# Patient Record
Sex: Female | Born: 1965 | Race: White | Hispanic: No | Marital: Single | Smoking: Former smoker
Health system: Southern US, Community
[De-identification: ages and names within clinical notes are randomized; demographics above are authoritative.]

## PROBLEM LIST (undated history)

## (undated) DIAGNOSIS — I10 Essential (primary) hypertension: Secondary | ICD-10-CM

## (undated) DIAGNOSIS — K635 Polyp of colon: Secondary | ICD-10-CM

## (undated) HISTORY — PX: CERVIX REMOVAL: SHX592

## (undated) HISTORY — PX: BREAST SURGERY: SHX581

## (undated) HISTORY — PX: APPENDECTOMY: SHX54

---

## 2005-04-05 ENCOUNTER — Ambulatory Visit: Payer: Self-pay

## 2005-09-17 HISTORY — PX: BREAST BIOPSY: SHX20

## 2006-04-23 ENCOUNTER — Ambulatory Visit: Payer: Self-pay

## 2018-02-14 ENCOUNTER — Ambulatory Visit (INDEPENDENT_AMBULATORY_CARE_PROVIDER_SITE_OTHER): Payer: Managed Care, Other (non HMO) | Admitting: Physician Assistant

## 2018-02-14 ENCOUNTER — Encounter: Payer: Self-pay | Admitting: Physician Assistant

## 2018-02-14 ENCOUNTER — Telehealth: Payer: Self-pay | Admitting: Physician Assistant

## 2018-02-14 VITALS — BP 122/78 | HR 60 | Temp 98.3°F | Resp 16 | Ht 64.0 in | Wt 176.0 lb

## 2018-02-14 DIAGNOSIS — Z114 Encounter for screening for human immunodeficiency virus [HIV]: Secondary | ICD-10-CM

## 2018-02-14 DIAGNOSIS — Z130 Encounter for screening for diseases of the blood and blood-forming organs and certain disorders involving the immune mechanism: Secondary | ICD-10-CM

## 2018-02-14 DIAGNOSIS — Z1329 Encounter for screening for other suspected endocrine disorder: Secondary | ICD-10-CM

## 2018-02-14 DIAGNOSIS — Z124 Encounter for screening for malignant neoplasm of cervix: Secondary | ICD-10-CM

## 2018-02-14 DIAGNOSIS — Z1231 Encounter for screening mammogram for malignant neoplasm of breast: Secondary | ICD-10-CM | POA: Diagnosis not present

## 2018-02-14 DIAGNOSIS — Z13220 Encounter for screening for lipoid disorders: Secondary | ICD-10-CM | POA: Diagnosis not present

## 2018-02-14 DIAGNOSIS — Z0000 Encounter for general adult medical examination without abnormal findings: Secondary | ICD-10-CM | POA: Diagnosis not present

## 2018-02-14 DIAGNOSIS — Z131 Encounter for screening for diabetes mellitus: Secondary | ICD-10-CM | POA: Diagnosis not present

## 2018-02-14 DIAGNOSIS — Z1239 Encounter for other screening for malignant neoplasm of breast: Secondary | ICD-10-CM

## 2018-02-14 DIAGNOSIS — Z1211 Encounter for screening for malignant neoplasm of colon: Secondary | ICD-10-CM

## 2018-02-14 NOTE — Telephone Encounter (Signed)
Order for cologuard faxed to [facility] °

## 2018-02-14 NOTE — Patient Instructions (Signed)

## 2018-02-14 NOTE — Progress Notes (Signed)
Patient: Heather Curry Female    DOB: 10-04-1965   52 y.o.   MRN: [id number] Visit Date: 02/14/2018  Today's Provider: Trinna Post, PA-C   Chief Complaint  Patient presents with  . Establish Care   Subjective:    HPI   Heather Curry is a 52 y/o woman presenting today for a new patient physical. She works as a Higher education careers adviser and was recently transferred from [employer] to [employer]. She is living alone in [place], does not have children. She reports she has a uterus and ovaries but not a cervix.  She says when she was 20 she had an abnormal PAP. Then ten years later she had a "grade four" PAP and was recommended to have a hysterectomy. She said she didn't want one because she wanted to have children. She says she had her cervix removed in the office, her mom came with her. She says she hasn't had a PAP since then.   She reports she has had fibrocystic mass removed from her breasts previously, had a mammogram many years ago.   She has never been screened for colon cancer. She denies family history of colon cancer.      Allergies  Allergen Reactions  . Bee Venom Anaphylaxis    No current outpatient medications on file.  Review of Systems  Constitutional: Negative.   HENT: Negative.   Eyes: Negative.   Cardiovascular: Negative.   Gastrointestinal: Negative.   Endocrine: Negative.   Genitourinary: Negative.   Musculoskeletal: Negative.   Skin: Negative.   Allergic/Immunologic: Negative.   Neurological: Negative.   Hematological: Negative.   Psychiatric/Behavioral: Negative.     Social History   Tobacco Use  . Smoking status: Never Smoker  . Smokeless tobacco: Never Used  Substance Use Topics  . Alcohol use: Never    Frequency: Never   Objective:   BP 122/78 (BP Location: Right Arm, Patient Position: Sitting, Cuff Size: Normal)   Pulse 60   Temp 98.3 F (36.8 C) (Oral)   Resp 16   Ht 5\' 4"  (1.626 m)   Wt 176 lb (79.8 kg)   BMI  30.21 kg/m  Vitals:   02/14/18 1420  BP: 122/78  Pulse: 60  Resp: 16  Temp: 98.3 F (36.8 C)  TempSrc: Oral  Weight: 176 lb (79.8 kg)  Height: 5\' 4"  (1.626 m)     Physical Exam  Constitutional: She appears well-developed and well-nourished.  Cardiovascular: Normal rate and regular rhythm.  Pulmonary/Chest: Effort normal and breath sounds normal.  Abdominal: Soft. Bowel sounds are normal.  Genitourinary: No labial fusion. There is no rash, tenderness, lesion or injury on the right labia. There is no rash, tenderness, lesion or injury on the left labia. Uterus is not deviated, not enlarged, not fixed and not tender. Cervix exhibits no motion tenderness, no discharge and no friability. Right adnexum displays no mass, no tenderness and no fullness. Left adnexum displays no mass, no tenderness and no fullness. No erythema, tenderness or bleeding in the vagina. No foreign body in the vagina. No signs of injury around the vagina. No vaginal discharge found.  Genitourinary Comments: Cervix is PRESENT.  Skin: Skin is warm and dry.  Psychiatric: She has a normal mood and affect. Her behavior is normal.        Assessment & Plan:     1. Annual physical exam   2. Colon cancer screening  - Cologuard  3. Breast cancer screening  -  MM 3D SCREEN BREAST BILATERAL; Future  4. Cervical cancer screening  Patient DOES have a cervix. Advised patient that she did not have her cervix removed, she likely had a LEEP or other procedure done for CIN.   - Pap IG and HPV (high risk) DNA detection  5. Encounter for screening for HIV  - HIV antibody (with reflex)  6. Lipid screening  - Lipid Profile  7. Diabetes mellitus screening  - Comprehensive Metabolic Panel (CMET)  8. Screening for deficiency anemia  - CBC with Differential  9. Thyroid disorder screening  - TSH  Return in about 1 year (around 02/15/2019) for CPE.  The entirety of the information documented in the History of  Present Illness, Review of Systems and Physical Exam were personally obtained by me. Portions of this information were initially documented by Ashley Royalty, CMA and reviewed by me for thoroughness and accuracy.         Trinna Post, PA-C  [facility]

## 2018-02-15 LAB — HIV ANTIBODY (ROUTINE TESTING W REFLEX): HIV Screen 4th Generation wRfx: NONREACTIVE

## 2018-02-15 LAB — COMPREHENSIVE METABOLIC PANEL
ALT: 30 IU/L (ref 0–32)
AST: 15 IU/L (ref 0–40)
Albumin/Globulin Ratio: 1.8 (ref 1.2–2.2)
Albumin: 4.5 g/dL (ref 3.5–5.5)
Alkaline Phosphatase: 118 IU/L — ABNORMAL HIGH (ref 39–117)
BUN/Creatinine Ratio: 16 (ref 9–23)
BUN: 13 mg/dL (ref 6–24)
Bilirubin Total: 0.2 mg/dL (ref 0.0–1.2)
CO2: 23 mmol/L (ref 20–29)
Calcium: 10.3 mg/dL — ABNORMAL HIGH (ref 8.7–10.2)
Chloride: 104 mmol/L (ref 96–106)
Creatinine, Ser: 0.8 mg/dL (ref 0.57–1.00)
GFR calc Af Amer: 99 mL/min/{1.73_m2} (ref >59)
GFR calc non Af Amer: 86 mL/min/{1.73_m2} (ref >59)
Globulin, Total: 2.5 g/dL (ref 1.5–4.5)
Glucose: 85 mg/dL (ref 65–99)
Potassium: 4.3 mmol/L (ref 3.5–5.2)
Sodium: 142 mmol/L (ref 134–144)
Total Protein: 7 g/dL (ref 6.0–8.5)

## 2018-02-15 LAB — CBC WITH DIFFERENTIAL/PLATELET
Basophils Absolute: 0 10*3/uL (ref 0.0–0.2)
Basos: 0 %
EOS (ABSOLUTE): 0.1 10*3/uL (ref 0.0–0.4)
Eos: 2 %
Hematocrit: 41.6 % (ref 34.0–46.6)
Hemoglobin: 13.8 g/dL (ref 11.1–15.9)
Immature Grans (Abs): 0 10*3/uL (ref 0.0–0.1)
Immature Granulocytes: 0 %
Lymphocytes Absolute: 2.9 10*3/uL (ref 0.7–3.1)
Lymphs: 38 %
MCH: 27.9 pg (ref 26.6–33.0)
MCHC: 33.2 g/dL (ref 31.5–35.7)
MCV: 84 fL (ref 79–97)
Monocytes Absolute: 0.7 10*3/uL (ref 0.1–0.9)
Monocytes: 9 %
Neutrophils Absolute: 4 10*3/uL (ref 1.4–7.0)
Neutrophils: 51 %
Platelets: 283 10*3/uL (ref 150–450)
RBC: 4.94 x10E6/uL (ref 3.77–5.28)
RDW: 13.6 % (ref 12.3–15.4)
WBC: 7.6 10*3/uL (ref 3.4–10.8)

## 2018-02-15 LAB — LIPID PANEL
Chol/HDL Ratio: 5.6 ratio — ABNORMAL HIGH (ref 0.0–4.4)
Cholesterol, Total: 209 mg/dL — ABNORMAL HIGH (ref 100–199)
HDL: 37 mg/dL — ABNORMAL LOW (ref >39)
LDL Calculated: 160 mg/dL — ABNORMAL HIGH (ref 0–99)
Triglycerides: 61 mg/dL (ref 0–149)
VLDL Cholesterol Cal: 12 mg/dL (ref 5–40)

## 2018-02-15 LAB — TSH: TSH: 3.92 u[IU]/mL (ref 0.450–4.500)

## 2018-02-18 ENCOUNTER — Telehealth: Payer: Self-pay

## 2018-02-18 NOTE — Telephone Encounter (Signed)
-----   Message from Trinna Post, [place] sent at 02/17/2018  2:59 PM EDT ----- Cholesterol high but not requiring treatment. CMET normal. CBC and TSH normal. HIV nonreactive. PAP is pending. Will send in 12.5 mg HCTZ which is fluid pill for her. Can see her back in one month for follow up of this.

## 2018-02-18 NOTE — Telephone Encounter (Signed)
Pt advised.   Thanks,   -Remmie Bembenek  

## 2018-02-19 LAB — PAP IG AND HPV HIGH-RISK
HPV, high-risk: NEGATIVE
PAP Smear Comment: 0

## 2018-02-20 ENCOUNTER — Telehealth: Payer: Self-pay

## 2018-02-20 NOTE — Telephone Encounter (Signed)
-----   Message from Trinna Post, [place] sent at 02/20/2018  9:15 AM EDT ----- PAP normal, HPV negative. I'm unsure of her history of her PAP smears. I would be inclined to repeat her pap in one year. If patient could remember provider so we can get records, that would be best so we can request them.

## 2018-02-20 NOTE — Telephone Encounter (Signed)
Pt advised.   Thanks,   -Genice Kimberlin  

## 2018-03-12 ENCOUNTER — Ambulatory Visit
Admission: RE | Admit: 2018-03-12 | Discharge: 2018-03-12 | Disposition: A | Discharge status: Home / Self Care | Payer: Managed Care, Other (non HMO) | Source: Ambulatory Visit | Attending: Physician Assistant | Admitting: Physician Assistant

## 2018-03-12 DIAGNOSIS — Z1231 Encounter for screening mammogram for malignant neoplasm of breast: Secondary | ICD-10-CM | POA: Insufficient documentation

## 2018-03-12 DIAGNOSIS — Z1239 Encounter for other screening for malignant neoplasm of breast: Secondary | ICD-10-CM

## 2018-03-15 LAB — COLOGUARD: Cologuard: POSITIVE

## 2018-04-01 ENCOUNTER — Telehealth: Payer: Self-pay | Admitting: Physician Assistant

## 2018-04-01 DIAGNOSIS — R195 Other fecal abnormalities: Secondary | ICD-10-CM

## 2018-04-01 NOTE — Telephone Encounter (Signed)
This patient had a positive cologuard witch will require a follow up colonoscopy. Can we call her and let her know? I have placed a referral to GI already. Thank you.

## 2018-04-02 ENCOUNTER — Other Ambulatory Visit: Payer: Self-pay

## 2018-04-02 ENCOUNTER — Telehealth: Payer: Self-pay | Admitting: Gastroenterology

## 2018-04-02 DIAGNOSIS — R195 Other fecal abnormalities: Secondary | ICD-10-CM

## 2018-04-02 NOTE — Telephone Encounter (Signed)
Patient advised.KW 

## 2018-04-02 NOTE — Telephone Encounter (Signed)
Patient needs to reschedule her procedure that same week but on Friday. Please call Wk [phone] Cell [phone]

## 2018-04-02 NOTE — Telephone Encounter (Signed)
Colonoscopy rescheduled to 04/18/18 [facility] Dr. Bonna Gains.  Notified Endo and updated referral with Traci.  Thanks [employer]

## 2018-04-04 ENCOUNTER — Encounter: Payer: Self-pay | Admitting: Physician Assistant

## 2018-04-08 ENCOUNTER — Other Ambulatory Visit: Payer: Self-pay

## 2018-04-18 ENCOUNTER — Ambulatory Visit
Admission: RE | Admit: 2018-04-18 | Discharge: 2018-04-18 | Disposition: A | Discharge status: Home / Self Care | Payer: Managed Care, Other (non HMO) | Source: Ambulatory Visit | Attending: Gastroenterology | Admitting: Gastroenterology

## 2018-04-18 ENCOUNTER — Encounter
Admission: RE | Disposition: A | Discharge status: Home / Self Care | Payer: Self-pay | Source: Ambulatory Visit | Attending: Gastroenterology

## 2018-04-18 ENCOUNTER — Encounter: Payer: Self-pay | Admitting: Anesthesiology

## 2018-04-18 ENCOUNTER — Ambulatory Visit: Payer: Managed Care, Other (non HMO) | Admitting: Anesthesiology

## 2018-04-18 DIAGNOSIS — K5730 Diverticulosis of large intestine without perforation or abscess without bleeding: Secondary | ICD-10-CM | POA: Diagnosis not present

## 2018-04-18 DIAGNOSIS — R195 Other fecal abnormalities: Secondary | ICD-10-CM | POA: Diagnosis present

## 2018-04-18 DIAGNOSIS — Z8249 Family history of ischemic heart disease and other diseases of the circulatory system: Secondary | ICD-10-CM | POA: Diagnosis not present

## 2018-04-18 DIAGNOSIS — Z91030 Bee allergy status: Secondary | ICD-10-CM | POA: Diagnosis not present

## 2018-04-18 DIAGNOSIS — D122 Benign neoplasm of ascending colon: Secondary | ICD-10-CM | POA: Diagnosis not present

## 2018-04-18 DIAGNOSIS — Z87892 Personal history of anaphylaxis: Secondary | ICD-10-CM | POA: Insufficient documentation

## 2018-04-18 DIAGNOSIS — D120 Benign neoplasm of cecum: Secondary | ICD-10-CM | POA: Diagnosis not present

## 2018-04-18 HISTORY — PX: COLONOSCOPY WITH PROPOFOL: SHX5780

## 2018-04-18 SURGERY — COLONOSCOPY WITH PROPOFOL
Anesthesia: General

## 2018-04-18 MED ORDER — PROPOFOL 500 MG/50ML IV EMUL
INTRAVENOUS | Status: DC | PRN
  Administered 2018-04-18: 125 ug/kg/min via INTRAVENOUS

## 2018-04-18 MED ORDER — SODIUM CHLORIDE 0.9 % IV SOLN
INTRAVENOUS | Status: DC | PRN
  Administered 2018-04-18: 11:00:00 via INTRAVENOUS

## 2018-04-18 MED ORDER — LIDOCAINE HCL (PF) 2 % IJ SOLN
INTRAMUSCULAR | Status: DC | PRN
  Administered 2018-04-18: 60 mg via INTRADERMAL

## 2018-04-18 MED ORDER — METHYLENE BLUE 0.5 % INJ SOLN
INTRAVENOUS | Status: DC | PRN
  Administered 2018-04-18 (×2): 10 mL via SUBMUCOSAL

## 2018-04-18 MED ORDER — SODIUM CHLORIDE 0.9 % IJ SOLN
INTRAMUSCULAR | Status: DC | PRN
  Administered 2018-04-18: 5 mL via INTRAVENOUS

## 2018-04-18 MED ORDER — SPOT INK MARKER SYRINGE KIT
PACK | SUBMUCOSAL | Status: DC | PRN
  Administered 2018-04-18: 5 mL via SUBMUCOSAL

## 2018-04-18 MED ORDER — SODIUM CHLORIDE 0.9 % IV SOLN
INTRAVENOUS | Status: DC
  Administered 2018-04-18: 1000 mL via INTRAVENOUS

## 2018-04-18 MED ORDER — PROPOFOL 500 MG/50ML IV EMUL
INTRAVENOUS | Status: AC
  Filled 2018-04-18: qty 50

## 2018-04-18 MED ORDER — PROPOFOL 10 MG/ML IV BOLUS
INTRAVENOUS | Status: DC | PRN
  Administered 2018-04-18 (×2): 20 mg via INTRAVENOUS
  Administered 2018-04-18: 30 mg via INTRAVENOUS
  Administered 2018-04-18: 20 mg via INTRAVENOUS

## 2018-04-18 NOTE — Anesthesia Postprocedure Evaluation (Signed)
Anesthesia Post Note  Patient: Heather Curry  Procedure(s) Performed: COLONOSCOPY WITH PROPOFOL (N/A )  Patient location during evaluation: Endoscopy Anesthesia Type: General Level of consciousness: awake and alert and oriented Pain management: pain level controlled Vital Signs Assessment: post-procedure vital signs reviewed and stable Respiratory status: spontaneous breathing Cardiovascular status: blood pressure returned to baseline Anesthetic complications: no     Last Vitals:  Vitals:   04/18/18 0833 04/18/18 1100  BP: (!) 128/91 (!) (P) 91/52  Pulse: 66 (P) 64  Resp: 20 (P) 20  Temp: (!) 35.8 C (!) (P) 36.1 C  SpO2: 100% (P) 100%    Last Pain:  Vitals:   04/18/18 1100  TempSrc: (P) Tympanic  PainSc:                  Charmane Protzman

## 2018-04-18 NOTE — Op Note (Signed)
[facility] Patient Name: Amberlyn Martinezgarcia Procedure Date: 04/18/2018 9:27 AM MRN: [id number] Account #: [id number] Date of Birth: 1966-09-03 Admit Type: Outpatient Age: 52 Room: [facility] ENDO ROOM 2 Gender: Female Note Status: Finalized Procedure:            Colonoscopy Indications:          Positive Cologuard test Providers:            Tawfiq Favila B. Bonna Gains MD, MD Referring MD:         Kipp Brood. Margretta Sidle (Referring MD) Medicines:            Monitored Anesthesia Care Complications:        No immediate complications. Procedure:            Pre-Anesthesia Assessment:                       - ASA Grade Assessment: I - A normal, healthy patient.                       - Prior to the procedure, a History and Physical was                        performed, and patient medications, allergies and                        sensitivities were reviewed. The patient's tolerance of                        previous anesthesia was reviewed.                       - The risks and benefits of the procedure and the                        sedation options and risks were discussed with the                        patient. All questions were answered and informed                        consent was obtained.                       - Patient identification and proposed procedure were                        verified prior to the procedure by the physician, the                        nurse, the anesthesiologist, the anesthetist and the                        technician. The procedure was verified in the procedure                        room.                       After obtaining informed consent, the colonoscope was  passed under direct vision. Throughout the procedure,                        the patient's blood pressure, pulse, and oxygen                        saturations were monitored continuously. The                        Colonoscope was introduced through the  anus and                        advanced to the the cecum, identified by appendiceal                        orifice and ileocecal valve. The colonoscopy was                        performed with ease. The patient tolerated the                        procedure well. The quality of the bowel preparation                        was good. Findings:      The perianal and digital rectal examinations were normal.      Two sessile polyps were found in the cecum. The polyps were 2 to 4 mm in       size. These polyps were removed with a cold biopsy forceps. Resection       and retrieval were complete.      A 12 mm polyp was found in the ileocecal valve. The polyp was sessile.       Area was successfully injected with saline for lesion assessment, and       this injection appeared to lift the lesion adequately. The polyp was       removed with a hot snare. Resection and retrieval were complete. To       close a defect after polypectomy, two hemostatic clips were successfully       placed. There was no bleeding at the end of the procedure. (After       deploying these two clips two small 65mm foreign bodies were seen near       the site, likely from the clip mechanism. These are not sharp and did       not need to be removed. Subsequent clips did not result in any other       foreign bodies. Charge nurse and clip manufacturer notified and new       batch of clips were used for subsequent clips).      A 10 mm polyp was found in the ascending colon. The polyp was sessile.       Area was successfully injected with Eleview for lesion assessment, and       this injection appeared to lift the lesion adequately. The polyp was       removed with a hot snare. Suspect incomplete removal due to polyp being       situated within a fold. Retrieved. Area was successfully injected with       Spot (carbon black) for tattooing.      A 12 mm polyp  was found in the ascending colon. The polyp was       semi-pedunculated.  The polyp was removed with a hot snare. Resection and       retrieval were complete. To close a defect after polypectomy, two       hemostatic clips were successfully placed. There was no bleeding at the       end of the procedure.      The ileocecal valve and the semi-pedunculated ascending colon polyps       were too large to suction and had to be removed together via roth net.       The roth net thus affected visualization during withdrawal. Repeat       colonoscopy for re-evaluation of tattoo site above will also allow for       more thorough evaluation of the rest of the colon.      Multiple diverticula were found in the sigmoid colon.      The exam was otherwise without abnormality.      The rectum, sigmoid colon, descending colon, transverse colon, ascending       colon and cecum appeared normal.      The retroflexed view of the distal rectum and anal verge was normal and       showed no anal or rectal abnormalities. Impression:           - Two 2 to 4 mm polyps in the cecum, removed with a                        cold biopsy forceps. Resected and retrieved.                       - One 12 mm polyp at the ileocecal valve, removed with                        a hot snare. Resected and retrieved. Injected. Clips                        were placed.                       - One 10 mm polyp in the ascending colon, removed with                        a hot snare (Suspect incomplete removal as detailed                        above). Tattoo placed. Injected.                       - One 12 mm polyp in the ascending colon, removed with                        a hot snare. Resected and retrieved. Clips were placed.                       - Diverticulosis in the sigmoid colon.                       - The examination was otherwise normal.                       -  The rectum, sigmoid colon, descending colon,                        transverse colon, ascending colon and cecum are normal.                        - The distal rectum and anal verge are normal on                        retroflexion view. Recommendation:       - Discharge patient to home (with escort).                       - Advance diet as tolerated.                       - Continue present medications.                       - Await pathology results.                       - Repeat colonoscopy within 3 months for surveillance.                       - The findings and recommendations were discussed with                        the patient.                       - The findings and recommendations were discussed with                        the patient's family.                       - Return to primary care physician as previously                        scheduled.                       - High fiber diet. Procedure Code(s):    --- Professional ---                       [phone], Colonoscopy, flexible; with removal of tumor(s),                        polyp(s), or other lesion(s) by snare technique                       45380, 59, Colonoscopy, flexible; with biopsy, single                        or multiple                       45381, Colonoscopy, flexible; with directed submucosal                        injection(s), any substance Diagnosis Code(s):    --- Professional ---  D12.0, Benign neoplasm of cecum                       D12.2, Benign neoplasm of ascending colon                       R19.5, Other fecal abnormalities                       K57.30, Diverticulosis of large intestine without                        perforation or abscess without bleeding CPT copyright 2017 American Medical Association. All rights reserved. The codes documented in this report are preliminary and upon coder review may  be revised to meet current compliance requirements.  Vonda Antigua, MD Margretta Sidle B. Bonna Gains MD, MD 04/18/2018 11:09:10 AM This report has been signed electronically. Number of Addenda: 0 Note Initiated On:  04/18/2018 9:27 AM Scope Withdrawal Time: 0 hours 52 minutes 27 seconds  Total Procedure Duration: 1 hour 7 minutes 10 seconds  Estimated Blood Loss: Estimated blood loss: none.      [facility]

## 2018-04-18 NOTE — Anesthesia Post-op Follow-up Note (Signed)
Anesthesia QCDR form completed.        

## 2018-04-18 NOTE — H&P (Signed)
Vonda Antigua, MD [address], [place], [place], [place], [address] [address], [place], [place], [place], [address] Phone: [phone]  Fax: [phone]  Primary Care Physician:  Trinna Post, PA-C   Pre-Procedure History & Physical: HPI:  Heather Curry is a 52 y.o. female is here for a colonoscopy.   No past medical history on file.  Past Surgical History:  Procedure Laterality Date  . APPENDECTOMY    . BREAST BIOPSY Right 2007   neg  . BREAST SURGERY Left    Lumpectomy  . CERVIX REMOVAL      Prior to Admission medications   Not on File    Allergies as of 04/02/2018 - Review Complete 02/14/2018  Allergen Reaction Noted  . Bee venom Anaphylaxis 02/14/2018    Family History  Problem Relation Age of Onset  . Congestive Heart Failure Mother   . Hypertension Mother   . Arthritis Mother   . Alcohol abuse Father   . Hypertension Father   . COPD Father   . Arthritis Maternal Grandmother   . Cancer Maternal Grandmother   . Hypertension Sister   . Breast cancer Maternal Aunt 80    Social History   Socioeconomic History  . Marital status: Single    Spouse name: Not on file  . Number of children: Not on file  . Years of education: Not on file  . Highest education level: Not on file  Occupational History  . Not on file  Social Needs  . Financial resource strain: Not on file  . Food insecurity:    Worry: Not on file    Inability: Not on file  . Transportation needs:    Medical: Not on file    Non-medical: Not on file  Tobacco Use  . Smoking status: Never Smoker  . Smokeless tobacco: Never Used  Substance and Sexual Activity  . Alcohol use: Never    Frequency: Never  . Drug use: Never  . Sexual activity: Not on file  Lifestyle  . Physical activity:    Days per week: Not on file    Minutes per session: Not on file  . Stress: Not on file  Relationships  . Social connections:    Talks on phone: Not on file    Gets together: Not on  file    Attends religious service: Not on file    Active member of club or organization: Not on file    Attends meetings of clubs or organizations: Not on file    Relationship status: Not on file  . Intimate partner violence:    Fear of current or ex partner: Not on file    Emotionally abused: Not on file    Physically abused: Not on file    Forced sexual activity: Not on file  Other Topics Concern  . Not on file  Social History Narrative  . Not on file    Review of Systems: See HPI, otherwise negative ROS  Physical Exam: There were no vitals taken for this visit. General:   Alert,  pleasant and cooperative in NAD Head:  Normocephalic and atraumatic. Neck:  Supple; no masses or thyromegaly. Lungs:  Clear throughout to auscultation, normal respiratory effort.    Heart:  +S1, +S2, Regular rate and rhythm, No edema. Abdomen:  Soft, nontender and nondistended. Normal bowel sounds, without guarding, and without rebound.   Neurologic:  Alert and  oriented x4;  grossly normal neurologically.  Impression/Plan: Heather Curry is here for  a colonoscopy to be performed for positive cologuard   Risks, benefits, limitations, and alternatives regarding  colonoscopy have been reviewed with the patient.  Questions have been answered.  All parties agreeable.   Virgel Manifold, MD  04/18/2018, 8:19 AM

## 2018-04-18 NOTE — Transfer of Care (Signed)
Immediate Anesthesia Transfer of Care Note  Patient: Heather Curry  Procedure(s) Performed: COLONOSCOPY WITH PROPOFOL (N/A )  Patient Location: PACU and Endoscopy Unit  Anesthesia Type:General  Level of Consciousness: awake, alert  and oriented  Airway & Oxygen Therapy: Patient Spontanous Breathing  Post-op Assessment: Report given to RN  Post vital signs: Reviewed and stable  Last Vitals:  Vitals Value Taken Time  BP 91/52   Temp 97   Pulse 63   Resp 16   SpO2 10063     Last Pain:  Vitals:   04/18/18 0833  TempSrc: Tympanic  PainSc: 0-No pain         Complications: No apparent anesthesia complications

## 2018-04-18 NOTE — Anesthesia Preprocedure Evaluation (Signed)
Anesthesia Evaluation  Patient identified by MRN, date of birth, ID band Patient awake    Reviewed: Allergy & Precautions, NPO status , Patient's Chart, lab work & pertinent test results  Airway Mallampati: II  TM Distance: >3 FB     Dental   Pulmonary neg pulmonary ROS,    Pulmonary exam normal        Cardiovascular negative cardio ROS Normal cardiovascular exam     Neuro/Psych negative neurological ROS  negative psych ROS   GI/Hepatic negative GI ROS, Neg liver ROS,   Endo/Other  negative endocrine ROS  Renal/GU negative Renal ROS  negative genitourinary   Musculoskeletal negative musculoskeletal ROS (+)   Abdominal Normal abdominal exam  (+)   Peds negative pediatric ROS (+)  Hematology negative hematology ROS (+)   Anesthesia Other Findings   Reproductive/Obstetrics                             Anesthesia Physical Anesthesia Plan  ASA: I  Anesthesia Plan: General   Post-op Pain Management:    Induction: Intravenous  PONV Risk Score and Plan: TIVA  Airway Management Planned:   Additional Equipment:   Intra-op Plan:   Post-operative Plan:   Informed Consent: I have reviewed the patients History and Physical, chart, labs and discussed the procedure including the risks, benefits and alternatives for the proposed anesthesia with the patient or authorized representative who has indicated his/her understanding and acceptance.   Dental advisory given  Plan Discussed with: CRNA and Surgeon  Anesthesia Plan Comments:         Anesthesia Quick Evaluation

## 2018-04-21 ENCOUNTER — Encounter: Payer: Self-pay | Admitting: Gastroenterology

## 2018-04-22 LAB — SURGICAL PATHOLOGY

## 2018-06-03 ENCOUNTER — Telehealth: Payer: Self-pay | Admitting: Gastroenterology

## 2018-06-03 NOTE — Telephone Encounter (Signed)
Pt says she had a preventive colon test & now is charged a "diagnostic" fee due to the dr findings and testing polyps during the procedure. Pt would like to resubmit claim an ask that the dr change the code to preventive. Call pt on cell [phone] Or work line [phone]

## 2018-06-04 ENCOUNTER — Telehealth: Payer: Self-pay | Admitting: Gastroenterology

## 2018-06-04 NOTE — Telephone Encounter (Signed)
LVM for patient to call me regarding questions about colonoscopy.

## 2018-06-04 NOTE — Telephone Encounter (Signed)
The Cologuard is the screening.  They are being referred from the pcp with abnormal results. I will call the patient and explain.

## 2018-06-04 NOTE — Telephone Encounter (Signed)
I spoke with pt regarding coding on colonoscopy. She was referred by PCP for colonoscopy for positive colorectal cancer screening using Cologuard test and this is what was coded on her colonoscopy. They found multiple polyps that were benign but precancerous, 2 of them large polyps. A 3 month colonoscopy is recommended. Pt was under the impression that it was to be a screening and she contacted her insurance and they pay for screening, even if polyps are found. Pt has [employer]. Pt also just found out that her aunt passed away from colon cancer and her aunt's daughter (pts cousin) with colon cancer. She was unaware of this prior to her colonoscopy. I encouraged pt to have her 3 month f/u colonoscopy due to one polyp noted in fold which was removed but needs to be rechecked and 2 large polyps and other multiple polyps. She did understand but she is single and unable to pay for an additional colonoscopy. She was billed approx. $5000. I informed pt that I would talk with our insurance co-ordinator and doctor about this. She was very Patent attorney.

## 2018-06-04 NOTE — Telephone Encounter (Signed)
Thank you both. I thought that was correct also but I told her I would check and be sure.

## 2019-01-05 ENCOUNTER — Telehealth: Payer: Self-pay

## 2019-01-05 DIAGNOSIS — Z86010 Personal history of colonic polyps: Secondary | ICD-10-CM

## 2019-01-05 NOTE — Telephone Encounter (Signed)
Patient has been scheduled repeat colonoscopy with Dr. Bonna Gains on 04/17/19 at [facility].  Dx History of Colon Polyp   Thanks Sharyn Lull

## 2019-04-13 ENCOUNTER — Other Ambulatory Visit: Payer: Self-pay

## 2019-04-13 DIAGNOSIS — Z86010 Personal history of colonic polyps: Secondary | ICD-10-CM

## 2019-04-13 MED ORDER — NA SULFATE-K SULFATE-MG SULF 17.5-3.13-1.6 GM/177ML PO SOLN: 1.0000 | Freq: Once | ORAL | 0 refills | Status: AC

## 2019-04-14 ENCOUNTER — Other Ambulatory Visit: Payer: Self-pay | Admitting: Physician Assistant

## 2019-04-14 ENCOUNTER — Other Ambulatory Visit
Admission: RE | Admit: 2019-04-14 | Discharge: 2019-04-14 | Disposition: A | Discharge status: Home / Self Care | Payer: Managed Care, Other (non HMO) | Source: Ambulatory Visit | Attending: Gastroenterology | Admitting: Gastroenterology

## 2019-04-14 ENCOUNTER — Other Ambulatory Visit: Payer: Self-pay

## 2019-04-14 DIAGNOSIS — Z20828 Contact with and (suspected) exposure to other viral communicable diseases: Secondary | ICD-10-CM | POA: Diagnosis present

## 2019-04-14 LAB — SARS CORONAVIRUS 2 (TAT 6-24 HRS): SARS Coronavirus 2: NEGATIVE

## 2019-04-17 ENCOUNTER — Ambulatory Visit: Payer: Managed Care, Other (non HMO) | Admitting: Registered Nurse

## 2019-04-17 ENCOUNTER — Encounter: Payer: Self-pay | Admitting: *Deleted

## 2019-04-17 ENCOUNTER — Ambulatory Visit
Admission: RE | Admit: 2019-04-17 | Discharge: 2019-04-17 | Disposition: A | Discharge status: Home / Self Care | Payer: Managed Care, Other (non HMO) | Source: Ambulatory Visit | Attending: Gastroenterology | Admitting: Gastroenterology

## 2019-04-17 ENCOUNTER — Encounter
Admission: RE | Disposition: A | Discharge status: Home / Self Care | Payer: Self-pay | Source: Ambulatory Visit | Attending: Gastroenterology

## 2019-04-17 DIAGNOSIS — K635 Polyp of colon: Secondary | ICD-10-CM

## 2019-04-17 DIAGNOSIS — Z09 Encounter for follow-up examination after completed treatment for conditions other than malignant neoplasm: Secondary | ICD-10-CM | POA: Insufficient documentation

## 2019-04-17 DIAGNOSIS — D120 Benign neoplasm of cecum: Secondary | ICD-10-CM | POA: Insufficient documentation

## 2019-04-17 DIAGNOSIS — Z860100 Personal history of colon polyps, unspecified: Secondary | ICD-10-CM

## 2019-04-17 DIAGNOSIS — D122 Benign neoplasm of ascending colon: Secondary | ICD-10-CM | POA: Diagnosis not present

## 2019-04-17 HISTORY — PX: COLONOSCOPY WITH PROPOFOL: SHX5780

## 2019-04-17 SURGERY — COLONOSCOPY WITH PROPOFOL
Anesthesia: General

## 2019-04-17 MED ORDER — GLYCOPYRROLATE 0.2 MG/ML IJ SOLN
INTRAMUSCULAR | Status: DC | PRN
  Administered 2019-04-17: 0.2 mg via INTRAVENOUS

## 2019-04-17 MED ORDER — LIDOCAINE HCL (CARDIAC) PF 100 MG/5ML IV SOSY
PREFILLED_SYRINGE | INTRAVENOUS | Status: DC | PRN
  Administered 2019-04-17: 60 mg via INTRAVENOUS

## 2019-04-17 MED ORDER — PROPOFOL 500 MG/50ML IV EMUL
INTRAVENOUS | Status: AC
  Filled 2019-04-17: qty 50

## 2019-04-17 MED ORDER — EPHEDRINE SULFATE 50 MG/ML IJ SOLN
INTRAMUSCULAR | Status: AC
  Filled 2019-04-17: qty 1

## 2019-04-17 MED ORDER — PROPOFOL 500 MG/50ML IV EMUL
INTRAVENOUS | Status: AC
  Filled 2019-04-17: qty 150

## 2019-04-17 MED ORDER — SODIUM CHLORIDE 0.9 % IV SOLN
INTRAVENOUS | Status: DC
  Administered 2019-04-17: 1000 mL via INTRAVENOUS

## 2019-04-17 MED ORDER — PHENYLEPHRINE HCL (PRESSORS) 10 MG/ML IV SOLN
INTRAVENOUS | Status: AC
  Filled 2019-04-17: qty 1

## 2019-04-17 MED ORDER — EPHEDRINE SULFATE 50 MG/ML IJ SOLN
INTRAMUSCULAR | Status: DC | PRN
  Administered 2019-04-17: 5 mg via INTRAVENOUS

## 2019-04-17 MED ORDER — SUCCINYLCHOLINE CHLORIDE 20 MG/ML IJ SOLN
INTRAMUSCULAR | Status: AC
  Filled 2019-04-17: qty 1

## 2019-04-17 MED ORDER — PROPOFOL 500 MG/50ML IV EMUL
INTRAVENOUS | Status: DC | PRN
  Administered 2019-04-17: 140 ug/kg/min via INTRAVENOUS

## 2019-04-17 MED ORDER — PROPOFOL 10 MG/ML IV BOLUS
INTRAVENOUS | Status: AC
  Filled 2019-04-17: qty 20

## 2019-04-17 MED ORDER — SODIUM CHLORIDE (PF) 0.9 % IJ SOLN
INTRAMUSCULAR | Status: DC | PRN
  Administered 2019-04-17: 10 mL

## 2019-04-17 MED ORDER — PROPOFOL 10 MG/ML IV BOLUS
INTRAVENOUS | Status: DC | PRN
  Administered 2019-04-17: 70 mg via INTRAVENOUS
  Administered 2019-04-17: 20 mg via INTRAVENOUS
  Administered 2019-04-17: 30 mg via INTRAVENOUS

## 2019-04-17 MED ORDER — LIDOCAINE HCL (PF) 2 % IJ SOLN
INTRAMUSCULAR | Status: AC
  Filled 2019-04-17: qty 10

## 2019-04-17 NOTE — H&P (Signed)
Heather Antigua, MD [address] [place], [place], [place], [address] [address], [place], [place], [place], [address] Phone: [phone]  Fax: [phone]  Primary Care Physician:  Trinna Post, PA-C   Pre-Procedure History & Physical: HPI:  Heather Curry is a 53 y.o. female is here for a colonoscopy.   History reviewed. No pertinent past medical history.  Past Surgical History:  Procedure Laterality Date  . APPENDECTOMY    . BREAST BIOPSY Right 2007   neg  . BREAST SURGERY Left    Lumpectomy  . CERVIX REMOVAL    . COLONOSCOPY WITH PROPOFOL N/A 04/18/2018   Procedure: COLONOSCOPY WITH PROPOFOL;  Surgeon: Virgel Manifold, MD;  Location: [facility] ENDOSCOPY;  Service: Gastroenterology;  Laterality: N/A;    Prior to Admission medications   Not on File    Allergies as of 04/14/2019 - Review Complete 04/18/2018  Allergen Reaction Noted  . Bee venom Anaphylaxis 02/14/2018    Family History  Problem Relation Age of Onset  . Congestive Heart Failure Mother   . Hypertension Mother   . Arthritis Mother   . Alcohol abuse Father   . Hypertension Father   . COPD Father   . Arthritis Maternal Grandmother   . Cancer Maternal Grandmother   . Hypertension Sister   . Breast cancer Maternal Aunt 80    Social History   Socioeconomic History  . Marital status: Single    Spouse name: Not on file  . Number of children: Not on file  . Years of education: Not on file  . Highest education level: Not on file  Occupational History  . Not on file  Social Needs  . Financial resource strain: Not on file  . Food insecurity    Worry: Not on file    Inability: Not on file  . Transportation needs    Medical: Not on file    Non-medical: Not on file  Tobacco Use  . Smoking status: Never Smoker  . Smokeless tobacco: Never Used  Substance and Sexual Activity  . Alcohol use: Never    Frequency: Never  . Drug use: Never  . Sexual activity: Not on file  Lifestyle   . Physical activity    Days per week: Not on file    Minutes per session: Not on file  . Stress: Not on file  Relationships  . Social Herbalist on phone: Not on file    Gets together: Not on file    Attends religious service: Not on file    Active member of club or organization: Not on file    Attends meetings of clubs or organizations: Not on file    Relationship status: Not on file  . Intimate partner violence    Fear of current or ex partner: Not on file    Emotionally abused: Not on file    Physically abused: Not on file    Forced sexual activity: Not on file  Other Topics Concern  . Not on file  Social History Narrative  . Not on file    Review of Systems: See HPI, otherwise negative ROS  Physical Exam: BP 135/79   Pulse (!) 56   Temp 98.2 F (36.8 C) (Tympanic)   Resp 18   Ht 5\' 4"  (1.626 m)   Wt 79.4 kg   SpO2 100%   BMI 30.04 kg/m  General:   Alert,  pleasant and cooperative in NAD Head:  Normocephalic and atraumatic. Neck:  Supple; no masses or thyromegaly. Lungs:  Clear throughout to auscultation, normal respiratory effort.    Heart:  +S1, +S2, Regular rate and rhythm, No edema. Abdomen:  Soft, nontender and nondistended. Normal bowel sounds, without guarding, and without rebound.   Neurologic:  Alert and  oriented x4;  grossly normal neurologically.  Impression/Plan: Heather Curry is here for a colonoscopy to be performed for polyp surveillance.   Risks, benefits, limitations, and alternatives regarding  colonoscopy have been reviewed with the patient.  Questions have been answered.  All parties agreeable.   Virgel Manifold, MD  04/17/2019, 8:16 AM

## 2019-04-17 NOTE — Anesthesia Postprocedure Evaluation (Signed)
Anesthesia Post Note  Patient: Heather Curry  Procedure(s) Performed: COLONOSCOPY WITH PROPOFOL (N/A )  Patient location during evaluation: Endoscopy Anesthesia Type: General Level of consciousness: awake and alert and oriented Pain management: pain level controlled Vital Signs Assessment: post-procedure vital signs reviewed and stable Respiratory status: spontaneous breathing, nonlabored ventilation and respiratory function stable Cardiovascular status: blood pressure returned to baseline and stable Postop Assessment: no signs of nausea or vomiting Anesthetic complications: no     Last Vitals:  Vitals:   04/17/19 0938 04/17/19 1007  BP: 90/76 116/80  Pulse: 72   Resp: 18 20  Temp: (!) 36.3 C   SpO2: 99%     Last Pain:  Vitals:   04/17/19 0948  TempSrc:   PainSc: 0-No pain                 Sahmir Weatherbee

## 2019-04-17 NOTE — Anesthesia Post-op Follow-up Note (Signed)
Anesthesia QCDR form completed.        

## 2019-04-17 NOTE — Anesthesia Preprocedure Evaluation (Signed)
Anesthesia Evaluation  Patient identified by MRN, date of birth, ID band Patient awake    Reviewed: Allergy & Precautions, NPO status , Patient's Chart, lab work & pertinent test results  History of Anesthesia Complications Negative for: history of anesthetic complications  Airway Mallampati: II  TM Distance: >3 FB Neck ROM: Full    Dental no notable dental hx.    Pulmonary neg pulmonary ROS, neg sleep apnea, neg COPD,    breath sounds clear to auscultation- rhonchi (-) wheezing      Cardiovascular Exercise Tolerance: Good (-) hypertension(-) CAD and (-) Past MI  Rhythm:Regular Rate:Normal - Systolic murmurs and - Diastolic murmurs    Neuro/Psych negative neurological ROS  negative psych ROS   GI/Hepatic negative GI ROS, Neg liver ROS,   Endo/Other  negative endocrine ROSneg diabetes  Renal/GU negative Renal ROS     Musculoskeletal negative musculoskeletal ROS (+)   Abdominal (+) + obese,   Peds  Hematology negative hematology ROS (+)   Anesthesia Other Findings   Reproductive/Obstetrics                             Anesthesia Physical Anesthesia Plan  ASA: II  Anesthesia Plan: General   Post-op Pain Management:    Induction: Intravenous  PONV Risk Score and Plan: 2 and Propofol infusion  Airway Management Planned: Natural Airway  Additional Equipment:   Intra-op Plan:   Post-operative Plan:   Informed Consent: I have reviewed the patients History and Physical, chart, labs and discussed the procedure including the risks, benefits and alternatives for the proposed anesthesia with the patient or authorized representative who has indicated his/her understanding and acceptance.     Dental advisory given  Plan Discussed with: CRNA and Anesthesiologist  Anesthesia Plan Comments:         Anesthesia Quick Evaluation

## 2019-04-17 NOTE — Op Note (Signed)
[facility] Gastroenterology Patient Name: Heather Curry Procedure Date: 04/17/2019 8:17 AM MRN: [id number] Account #: [id number] Date of Birth: 12-Apr-1966 Admit Type: Outpatient Age: 53 Room: [facility] ENDO ROOM 1 Gender: Female Note Status: Finalized Procedure:            Colonoscopy Indications:          High risk colon cancer surveillance: Personal history                        of colonic polyps Providers:            Remedios Mckone B. Bonna Gains MD, MD Referring MD:         Wendee Beavers. Terrilee Croak (Referring MD) Medicines:            Monitored Anesthesia Care Complications:        No immediate complications. Procedure:            Pre-Anesthesia Assessment:                       - ASA Grade Assessment: II - A patient with mild                        systemic disease.                       - Prior to the procedure, a History and Physical was                        performed, and patient medications, allergies and                        sensitivities were reviewed. The patient's tolerance of                        previous anesthesia was reviewed.                       - The risks and benefits of the procedure and the                        sedation options and risks were discussed with the                        patient. All questions were answered and informed                        consent was obtained.                       - Patient identification and proposed procedure were                        verified prior to the procedure by the physician, the                        nurse, the anesthesiologist, the anesthetist and the                        technician. The procedure was verified in the procedure  room.                       After obtaining informed consent, the colonoscope was                        passed under direct vision. Throughout the procedure,                        the patient's blood pressure, pulse, and oxygen   saturations were monitored continuously. The                        Colonoscope was introduced through the anus and                        advanced to the the cecum, identified by appendiceal                        orifice and ileocecal valve. The colonoscopy was                        performed with ease. The patient tolerated the                        procedure well. The quality of the bowel preparation                        was good. Findings:      The perianal and digital rectal examinations were normal.      A 7 mm polyp was found in the cecum. The polyp was sessile. The polyp       was removed with a cold snare. Resection and retrieval were complete.      A post polypectomy scar was found at the ileocecal valve. The scar       tissue was healthy in appearance. There was no evidence of the previous       polyp. Biopsies were taken with a cold forceps for histology.      A 2 mm polyp was found in the ascending colon. The polyp was sessile.       The polyp was removed with a cold biopsy forceps. Resection and       retrieval were complete.      A 10 mm, non-bleeding polyp was found in the ascending colon at a site       of previous tattooing. The polyp was flat. Area was successfully       injected with saline for lesion assessment, and this injection appeared       to lift the lesion adequately. Polypectomy was attempted, initially       using a hot snare. Polyp resection was incomplete with this device. This       intervention then required a different device and polypectomy technique.       The polyp was removed with a piecemeal technique using a cold snare.       Resection and retrieval were complete. To prevent bleeding after the       polypectomy, three hemostatic clips were successful and one hemostatic       clip was unsuccessfully placed. There was no bleeding at the end of the       procedure.  Four sessile polyps were found in the sigmoid colon. The polyps were 3        to 4 mm in size. These polyps were removed with a cold biopsy forceps.       Resection and retrieval were complete.      The exam was otherwise without abnormality.      The rectum, sigmoid colon, descending colon, transverse colon, ascending       colon and cecum appeared normal.      The retroflexed view of the distal rectum and anal verge was normal and       showed no anal or rectal abnormalities. Impression:           - One 7 mm polyp in the cecum, removed with a cold                        snare. Resected and retrieved.                       - Post-polypectomy scar at the ileocecal valve.                        Biopsied.                       - One 2 mm polyp in the ascending colon, removed with a                        cold biopsy forceps. Resected and retrieved.                       - One 10 mm, non-bleeding polyp in the ascending colon,                        removed piecemeal using a cold snare. Resected and                        retrieved. Injected. Clips were placed.                       - Four 3 to 4 mm polyps in the sigmoid colon, removed                        with a cold biopsy forceps. Resected and retrieved.                       - The examination was otherwise normal.                       - The rectum, sigmoid colon, descending colon,                        transverse colon, ascending colon and cecum are normal.                       - The distal rectum and anal verge are normal on                        retroflexion view. Recommendation:       - Discharge patient to home (with escort).                       -  Advance diet as tolerated.                       - Continue present medications.                       - Await pathology results.                       - Repeat colonoscopy in 1 year, due to piecemeal                        polypectomy.                       - The findings and recommendations were discussed with                        the patient.                        - The findings and recommendations were discussed with                        the patient's family.                       - Return to primary care physician as previously                        scheduled. Procedure Code(s):    --- Professional ---                       [phone], Colonoscopy, flexible; with removal of tumor(s),                        polyp(s), or other lesion(s) by snare technique                       45380, 29, Colonoscopy, flexible; with biopsy, single                        or multiple                       45381, Colonoscopy, flexible; with directed submucosal                        injection(s), any substance Diagnosis Code(s):    --- Professional ---                       K63.5, Polyp of colon                       Z86.010, Personal history of colonic polyps                       Z98.890, Other specified postprocedural states CPT copyright 2019 American Medical Association. All rights reserved. The codes documented in this report are preliminary and upon coder review may  be revised to meet current compliance requirements.  Vonda Antigua, MD Margretta Sidle B. Bonna Gains MD, MD 04/17/2019 9:51:02 AM This report has been signed electronically. Number of Addenda: 0 Note Initiated On: 04/17/2019 8:17 AM Scope  Withdrawal Time: 0 hours 55 minutes 3 seconds  Total Procedure Duration: 1 hour 4 minutes 30 seconds  Estimated Blood Loss: Estimated blood loss: none.      [facility]

## 2019-04-17 NOTE — Transfer of Care (Signed)
Immediate Anesthesia Transfer of Care Note  Patient: Heather Curry  Procedure(s) Performed: COLONOSCOPY WITH PROPOFOL (N/A )  Patient Location: PACU  Anesthesia Type:General  Level of Consciousness: sedated  Airway & Oxygen Therapy: Patient Spontanous Breathing and Patient connected to nasal cannula oxygen  Post-op Assessment: Report given to RN and Post -op Vital signs reviewed and stable  Post vital signs: Reviewed and stable  Last Vitals:  Vitals Value Taken Time  BP 90/76 04/17/19 0938  Temp 36.3 C 04/17/19 0938  Pulse 75 04/17/19 0938  Resp 12 04/17/19 0938  SpO2 100 % 04/17/19 0938  Vitals shown include unvalidated device data.  Last Pain:  Vitals:   04/17/19 0938  TempSrc: Tympanic  PainSc: 0-No pain         Complications: No apparent anesthesia complications

## 2019-04-20 ENCOUNTER — Encounter: Payer: Self-pay | Admitting: Gastroenterology

## 2019-04-20 LAB — SURGICAL PATHOLOGY

## 2019-04-27 ENCOUNTER — Encounter: Payer: Self-pay | Admitting: Gastroenterology

## 2019-05-05 ENCOUNTER — Encounter: Payer: Self-pay | Admitting: Physician Assistant

## 2019-05-05 ENCOUNTER — Ambulatory Visit (INDEPENDENT_AMBULATORY_CARE_PROVIDER_SITE_OTHER): Payer: Managed Care, Other (non HMO) | Admitting: Physician Assistant

## 2019-05-05 ENCOUNTER — Other Ambulatory Visit (HOSPITAL_COMMUNITY)
Admission: RE | Admit: 2019-05-05 | Discharge: 2019-05-05 | Disposition: A | Discharge status: Home / Self Care | Payer: Managed Care, Other (non HMO) | Source: Ambulatory Visit | Attending: Physician Assistant | Admitting: Physician Assistant

## 2019-05-05 ENCOUNTER — Other Ambulatory Visit: Payer: Self-pay

## 2019-05-05 VITALS — BP 111/76 | HR 62 | Temp 96.9°F | Resp 16 | Ht 64.0 in | Wt 170.6 lb

## 2019-05-05 DIAGNOSIS — Z124 Encounter for screening for malignant neoplasm of cervix: Secondary | ICD-10-CM | POA: Diagnosis not present

## 2019-05-05 DIAGNOSIS — M6740 Ganglion, unspecified site: Secondary | ICD-10-CM

## 2019-05-05 DIAGNOSIS — Z1239 Encounter for other screening for malignant neoplasm of breast: Secondary | ICD-10-CM | POA: Diagnosis not present

## 2019-05-05 DIAGNOSIS — Z0000 Encounter for general adult medical examination without abnormal findings: Secondary | ICD-10-CM | POA: Diagnosis not present

## 2019-05-05 DIAGNOSIS — L821 Other seborrheic keratosis: Secondary | ICD-10-CM

## 2019-05-05 DIAGNOSIS — E785 Hyperlipidemia, unspecified: Secondary | ICD-10-CM

## 2019-05-05 DIAGNOSIS — I83813 Varicose veins of bilateral lower extremities with pain: Secondary | ICD-10-CM

## 2019-05-05 DIAGNOSIS — O2200 Varicose veins of lower extremity in pregnancy, unspecified trimester: Secondary | ICD-10-CM | POA: Diagnosis present

## 2019-05-05 DIAGNOSIS — Z23 Encounter for immunization: Secondary | ICD-10-CM

## 2019-05-05 NOTE — Addendum Note (Signed)
Addended by: Shawna Orleans on: 05/05/2019 02:22 PM   Modules accepted: Orders

## 2019-05-05 NOTE — Patient Instructions (Addendum)
[place] - compression stockings   Health Maintenance, Female Adopting a healthy lifestyle and getting preventive care are important in promoting health and wellness. Ask your health care provider about:  The right schedule for you to have regular tests and exams.  Things you can do on your own to prevent diseases and keep yourself healthy. What should I know about diet, weight, and exercise? Eat a healthy diet   Eat a diet that includes plenty of vegetables, fruits, low-fat dairy products, and lean protein.  Do not eat a lot of foods that are high in solid fats, added sugars, or sodium. Maintain a healthy weight Body mass index (BMI) is used to identify weight problems. It estimates body fat based on height and weight. Your health care provider can help determine your BMI and help you achieve or maintain a healthy weight. Get regular exercise Get regular exercise. This is one of the most important things you can do for your health. Most adults should:  Exercise for at least 150 minutes each week. The exercise should increase your heart rate and make you sweat (moderate-intensity exercise).  Do strengthening exercises at least twice a week. This is in addition to the moderate-intensity exercise.  Spend less time sitting. Even light physical activity can be beneficial. Watch cholesterol and blood lipids Have your blood tested for lipids and cholesterol at 52 years of age, then have this test every 5 years. Have your cholesterol levels checked more often if:  Your lipid or cholesterol levels are high.  You are older than 52 years of age.  You are at high risk for heart disease. What should I know about cancer screening? Depending on your health history and family history, you may need to have cancer screening at various ages. This may include screening for:  Breast cancer.  Cervical cancer.  Colorectal cancer.  Skin cancer.  Lung cancer. What should I know about  heart disease, diabetes, and high blood pressure? Blood pressure and heart disease  High blood pressure causes heart disease and increases the risk of stroke. This is more likely to develop in people who have high blood pressure readings, are of African descent, or are overweight.  Have your blood pressure checked: ? Every 3-5 years if you are 19-80 years of age. ? Every year if you are 54 years old or older. Diabetes Have regular diabetes screenings. This checks your fasting blood sugar level. Have the screening done:  Once every three years after age 14 if you are at a normal weight and have a low risk for diabetes.  More often and at a younger age if you are overweight or have a high risk for diabetes. What should I know about preventing infection? Hepatitis B If you have a higher risk for hepatitis B, you should be screened for this virus. Talk with your health care provider to find out if you are at risk for hepatitis B infection. Hepatitis C Testing is recommended for:  Everyone born from [birth year] through 1964.  Anyone with known risk factors for hepatitis C. Sexually transmitted infections (STIs)  Get screened for STIs, including gonorrhea and chlamydia, if: ? You are sexually active and are younger than 52 years of age. ? You are older than 52 years of age and your health care provider tells you that you are at risk for this type of infection. ? Your sexual activity has changed since you were last screened, and you are at increased risk for chlamydia  or gonorrhea. Ask your health care provider if you are at risk.  Ask your health care provider about whether you are at high risk for HIV. Your health care provider may recommend a prescription medicine to help prevent HIV infection. If you choose to take medicine to prevent HIV, you should first get tested for HIV. You should then be tested every 3 months for as long as you are taking the medicine. Pregnancy  If you are about to  stop having your period (premenopausal) and you may become pregnant, seek counseling before you get pregnant.  Take 400 to 800 micrograms (mcg) of folic acid every day if you become pregnant.  Ask for birth control (contraception) if you want to prevent pregnancy. Osteoporosis and menopause Osteoporosis is a disease in which the bones lose minerals and strength with aging. This can result in bone fractures. If you are 14 years old or older, or if you are at risk for osteoporosis and fractures, ask your health care provider if you should:  Be screened for bone loss.  Take a calcium or vitamin D supplement to lower your risk of fractures.  Be given hormone replacement therapy (HRT) to treat symptoms of menopause. Follow these instructions at home: Lifestyle  Do not use any products that contain nicotine or tobacco, such as cigarettes, e-cigarettes, and chewing tobacco. If you need help quitting, ask your health care provider.  Do not use street drugs.  Do not share needles.  Ask your health care provider for help if you need support or information about quitting drugs. Alcohol use  Do not drink alcohol if: ? Your health care provider tells you not to drink. ? You are pregnant, may be pregnant, or are planning to become pregnant.  If you drink alcohol: ? Limit how much you use to 0-1 drink a day. ? Limit intake if you are breastfeeding.  Be aware of how much alcohol is in your drink. In the [place] one drink equals one 12 oz bottle of beer (355 mL), one 5 oz glass of wine (148 mL), or one 1 oz glass of hard liquor (44 mL). General instructions  Schedule regular health, dental, and eye exams.  Stay current with your vaccines.  Tell your health care provider if: ? You often feel depressed. ? You have ever been abused or do not feel safe at home. Summary  Adopting a healthy lifestyle and getting preventive care are important in promoting health and wellness.  Follow your  health care provider's instructions about healthy diet, exercising, and getting tested or screened for diseases.  Follow your health care provider's instructions on monitoring your cholesterol and blood pressure. This information is not intended to replace advice given to you by your health care provider. Make sure you discuss any questions you have with your health care provider. Document Released: 03/19/2011 Document Revised: 08/27/2018 Document Reviewed: 08/27/2018 Elsevier Patient Education  2020 [employer].

## 2019-05-05 NOTE — Progress Notes (Signed)
Patient: Heather Curry, Female    DOB: 04-Dec-1965, 53 y.o.   MRN: [id number] Visit Date: 05/05/2019  Today's Provider: Trinna Post, PA-C   Chief Complaint  Patient presents with  . Annual Exam   Subjective:     Annual physical exam Heather Curry is a 53 y.o. female who presents today for health maintenance and complete physical. She feels well. She reports exercising none. She reports she is sleeping well. 02/14/18 CPE 02/14/18 Pap/HPV negative; history of LEEP and uncertain follow up, repeat this year 03/13/18 Mammogram BI-RADS 1, repeat this year 04/17/2019 Colonoscopy-polyps and needs follow up in one year Tetanus: 2008 she thinks  Moles: Reports several brown, warty plaques on her body and is concerned this is cancer.  Cyst: on left hand, it popped up recently and is making her finger go numb.  Tightness in chest - has been happening for on year. Happens pretty much constantly. She does sit at a desk all day. Does not have chest pain, SOB, wheezing. Does not have heartburn symptoms.  -----------------------------------------------------------   Review of Systems  Constitutional: Negative.   HENT: Negative.   Eyes: Negative.   Respiratory: Negative.   Cardiovascular: Negative.   Gastrointestinal: Negative.   Endocrine: Negative.   Genitourinary: Negative.   Musculoskeletal: Negative.   Skin: Negative.   Allergic/Immunologic: Negative.   Neurological: Negative.   Hematological: Negative.   Psychiatric/Behavioral: Negative.     Social History      She  reports that she has never smoked. She has never used smokeless tobacco. She reports that she does not drink alcohol or use drugs.       Social History   Socioeconomic History  . Marital status: Single    Spouse name: Not on file  . Number of children: Not on file  . Years of education: Not on file  . Highest education level: Not on file  Occupational History  . Not on file  Social Needs  .  Financial resource strain: Not on file  . Food insecurity    Worry: Not on file    Inability: Not on file  . Transportation needs    Medical: Not on file    Non-medical: Not on file  Tobacco Use  . Smoking status: Never Smoker  . Smokeless tobacco: Never Used  Substance and Sexual Activity  . Alcohol use: Never    Frequency: Never  . Drug use: Never  . Sexual activity: Not on file  Lifestyle  . Physical activity    Days per week: Not on file    Minutes per session: Not on file  . Stress: Not on file  Relationships  . Social Herbalist on phone: Not on file    Gets together: Not on file    Attends religious service: Not on file    Active member of club or organization: Not on file    Attends meetings of clubs or organizations: Not on file    Relationship status: Not on file  Other Topics Concern  . Not on file  Social History Narrative  . Not on file    No past medical history on file.   Patient Active Problem List   Diagnosis Date Noted  . History of colonic polyps   . Polyp of colon   . Positive colorectal cancer screening using Cologuard test   . Benign neoplasm of ascending colon   . Diverticulosis of large intestine without  diverticulitis   . Benign neoplasm of cecum     Past Surgical History:  Procedure Laterality Date  . APPENDECTOMY    . BREAST BIOPSY Right 2007   neg  . BREAST SURGERY Left    Lumpectomy  . CERVIX REMOVAL    . COLONOSCOPY WITH PROPOFOL N/A 04/18/2018   Procedure: COLONOSCOPY WITH PROPOFOL;  Surgeon: Virgel Manifold, MD;  Location: [facility];  Service: Gastroenterology;  Laterality: N/A;  . COLONOSCOPY WITH PROPOFOL N/A 04/17/2019   Procedure: COLONOSCOPY WITH PROPOFOL;  Surgeon: Virgel Manifold, MD;  Location: [facility];  Service: Endoscopy;  Laterality: N/A;    Family History        Family Status  Relation Name Status  . Mother  Alive  . Father  Deceased  . MGM  Deceased  . Sister  Alive  . Mat  Aunt  (Not Specified)        Her family history includes Alcohol abuse in her father; Arthritis in her maternal grandmother and mother; Breast cancer (age of onset: 13) in her maternal aunt; COPD in her father; Cancer in her maternal grandmother; Congestive Heart Failure in her mother; Hypertension in her father, mother, and sister.      Allergies  Allergen Reactions  . Bee Venom Anaphylaxis  . Penicillins     No current outpatient medications on file.   Patient Care Team: Paulene Floor as PCP - General (Physician Assistant)    Objective:    Vitals: BP 111/76 (BP Location: Left Arm, Patient Position: Sitting, Cuff Size: Large)   Pulse 62   Temp (!) 96.9 F (36.1 C) (Temporal)   Resp 16   Ht 5\' 4"  (1.626 m)   Wt 170 lb 9.6 oz (77.4 kg)   SpO2 100%   BMI 29.28 kg/m    Vitals:   05/05/19 1332  BP: 111/76  Pulse: 62  Resp: 16  Temp: (!) 96.9 F (36.1 C)  TempSrc: Temporal  SpO2: 100%  Weight: 170 lb 9.6 oz (77.4 kg)  Height: 5\' 4"  (1.626 m)     Physical Exam Exam conducted with a chaperone present.  Constitutional:      Appearance: Normal appearance.  Cardiovascular:     Rate and Rhythm: Normal rate and regular rhythm.     Pulses: Normal pulses.     Heart sounds: Normal heart sounds.  Pulmonary:     Effort: Pulmonary effort is normal.     Breath sounds: Normal breath sounds.  Chest:     Breasts:        Right: Normal.        Left: Normal.  Abdominal:     General: Abdomen is flat. Bowel sounds are normal.     Palpations: Abdomen is soft.  Genitourinary:    Exam position: Lithotomy position.     Cervix: Normal.     Uterus: Absent.   Skin:    General: Skin is warm and dry.  Neurological:     Mental Status: She is alert and oriented to person, place, and time. Mental status is at baseline.  Psychiatric:        Mood and Affect: Mood normal.        Behavior: Behavior normal.      Depression Screen PHQ 2/9 Scores 05/05/2019 02/17/2018  PHQ - 2  Score 0 0  PHQ- 9 Score 3 3       Assessment & Plan:     Routine Health Maintenance and Physical Exam  Exercise Activities and Dietary recommendations Goals   None      There is no immunization history on file for this patient.  Health Maintenance  Topic Date Due  . TETANUS/TDAP  09/21/1985  . INFLUENZA VACCINE  04/18/2019  . MAMMOGRAM  03/12/2020  . COLONOSCOPY  04/16/2020  . PAP SMEAR-Modifier  02/14/2021  . HIV Screening  Completed     Discussed health benefits of physical activity, and encouraged her to engage in regular exercise appropriate for her age and condition.   1. Annual physical exam   She will need to repeat colonoscopy in one year.   - Comprehensive Metabolic Panel (CMET) - CBC with Differential  2. Varicose veins of both lower extremities, painful  Counseled on compression stockings. Apply first thing in the morning and remove before bed.  3. Cervical cancer screening  Check again this year. If normal, will place into regular screening pool.  - Cytology - PAP  4. Breast cancer screening  - MM Digital Screening; Future  5. Hyperlipidemia, unspecified hyperlipidemia type  - Lipid Profile  6. Seborrheic keratoses  Counseled on benign nature of these lesions.  7. Ganglion cyst  Counseled this will likely not go away but is generally harmless. If it continues to bother her, she may call back for referral to orthopedist.   8. Need for Tdap vaccination  Updated today.   --------------------------------------------------------------------    Trinna Post, PA-C  [place]

## 2019-05-07 ENCOUNTER — Telehealth: Payer: Self-pay

## 2019-05-07 LAB — CYTOLOGY - PAP
Adequacy: ABSENT
Diagnosis: NEGATIVE
HPV: NOT DETECTED

## 2019-05-07 LAB — COMPREHENSIVE METABOLIC PANEL
ALT: 21 IU/L (ref 0–32)
AST: 12 IU/L (ref 0–40)
Albumin/Globulin Ratio: 1.7 (ref 1.2–2.2)
Albumin: 4.6 g/dL (ref 3.8–4.9)
Alkaline Phosphatase: 132 IU/L — ABNORMAL HIGH (ref 39–117)
BUN/Creatinine Ratio: 15 (ref 9–23)
BUN: 13 mg/dL (ref 6–24)
Bilirubin Total: 0.4 mg/dL (ref 0.0–1.2)
CO2: 22 mmol/L (ref 20–29)
Calcium: 10.3 mg/dL — ABNORMAL HIGH (ref 8.7–10.2)
Chloride: 104 mmol/L (ref 96–106)
Creatinine, Ser: 0.88 mg/dL (ref 0.57–1.00)
GFR calc Af Amer: 87 mL/min/{1.73_m2} (ref >59)
GFR calc non Af Amer: 76 mL/min/{1.73_m2} (ref >59)
Globulin, Total: 2.7 g/dL (ref 1.5–4.5)
Glucose: 78 mg/dL (ref 65–99)
Potassium: 4.6 mmol/L (ref 3.5–5.2)
Sodium: 141 mmol/L (ref 134–144)
Total Protein: 7.3 g/dL (ref 6.0–8.5)

## 2019-05-07 LAB — CBC WITH DIFFERENTIAL/PLATELET
Basophils Absolute: 0 10*3/uL (ref 0.0–0.2)
Basos: 0 %
EOS (ABSOLUTE): 0.1 10*3/uL (ref 0.0–0.4)
Eos: 2 %
Hematocrit: 41.9 % (ref 34.0–46.6)
Hemoglobin: 13.8 g/dL (ref 11.1–15.9)
Immature Grans (Abs): 0 10*3/uL (ref 0.0–0.1)
Immature Granulocytes: 0 %
Lymphocytes Absolute: 2.5 10*3/uL (ref 0.7–3.1)
Lymphs: 31 %
MCH: 27.2 pg (ref 26.6–33.0)
MCHC: 32.9 g/dL (ref 31.5–35.7)
MCV: 83 fL (ref 79–97)
Monocytes Absolute: 0.6 10*3/uL (ref 0.1–0.9)
Monocytes: 8 %
Neutrophils Absolute: 4.8 10*3/uL (ref 1.4–7.0)
Neutrophils: 59 %
Platelets: 255 10*3/uL (ref 150–450)
RBC: 5.07 x10E6/uL (ref 3.77–5.28)
RDW: 12.5 % (ref 11.7–15.4)
WBC: 8 10*3/uL (ref 3.4–10.8)

## 2019-05-07 LAB — LIPID PANEL
Chol/HDL Ratio: 5.4 ratio — ABNORMAL HIGH (ref 0.0–4.4)
Cholesterol, Total: 201 mg/dL — ABNORMAL HIGH (ref 100–199)
HDL: 37 mg/dL — ABNORMAL LOW (ref >39)
LDL Calculated: 148 mg/dL — ABNORMAL HIGH (ref 0–99)
Triglycerides: 78 mg/dL (ref 0–149)
VLDL Cholesterol Cal: 16 mg/dL (ref 5–40)

## 2019-05-07 NOTE — Telephone Encounter (Signed)
Patient advised and verbally voiced understanding.  

## 2019-05-07 NOTE — Telephone Encounter (Signed)
-----   Message from Trinna Post, [place] sent at 05/07/2019 10:40 AM EDT ----- Cholesterol slightly elevated but does not require cholesterol medication just now. Remaining labwork is normal.

## 2019-05-07 NOTE — Telephone Encounter (Signed)
[email or web address]

## 2020-05-17 ENCOUNTER — Other Ambulatory Visit: Payer: Self-pay

## 2020-05-17 ENCOUNTER — Telehealth (INDEPENDENT_AMBULATORY_CARE_PROVIDER_SITE_OTHER): Payer: Self-pay

## 2020-05-17 DIAGNOSIS — Z86010 Personal history of colonic polyps: Secondary | ICD-10-CM

## 2020-05-17 NOTE — Telephone Encounter (Signed)
Gastroenterology Pre-Procedure Review  Request Date: Wed 06/01/20 Requesting Physician: Dr. Bonna Gains  PATIENT REVIEW QUESTIONS: The patient responded to the following health history questions as indicated:    1. Are you having any GI issues? no 2. Do you have a personal history of Polyps? yes (2020 last performed by Dr. Bonna Gains) 3. Do you have a family history of Colon Cancer or Polyps? yes (aunt had colon cancer) 4. Diabetes Mellitus? no 5. Joint replacements in the past 12 months?no 6. Major health problems in the past 3 months?no 7. Any artificial heart valves, MVP, or defibrillator?no    MEDICATIONS & ALLERGIES:    Patient reports the following regarding taking any anticoagulation/antiplatelet therapy:   Plavix, Coumadin, Eliquis, Xarelto, Lovenox, Pradaxa, Brilinta, or Effient? no Aspirin? no  Patient confirms/reports the following medications:  No current outpatient medications on file.   No current facility-administered medications for this visit.    Patient confirms/reports the following allergies:  Allergies  Allergen Reactions  . Bee Venom Anaphylaxis  . Penicillins     No orders of the defined types were placed in this encounter.   AUTHORIZATION INFORMATION Primary Insurance: 1D#: Group #:  Secondary Insurance: 1D#: Group #:  SCHEDULE INFORMATION: Date: 06/01/20 Time: Location:[facility]

## 2020-05-18 ENCOUNTER — Other Ambulatory Visit: Payer: Self-pay

## 2020-05-18 ENCOUNTER — Telehealth: Payer: Managed Care, Other (non HMO)

## 2020-05-18 DIAGNOSIS — Z86010 Personal history of colonic polyps: Secondary | ICD-10-CM

## 2020-05-24 ENCOUNTER — Ambulatory Visit (INDEPENDENT_AMBULATORY_CARE_PROVIDER_SITE_OTHER): Payer: Managed Care, Other (non HMO) | Admitting: Physician Assistant

## 2020-05-24 ENCOUNTER — Other Ambulatory Visit: Payer: Self-pay | Admitting: Physician Assistant

## 2020-05-24 ENCOUNTER — Other Ambulatory Visit: Payer: Self-pay

## 2020-05-24 ENCOUNTER — Encounter: Payer: Self-pay | Admitting: Physician Assistant

## 2020-05-24 VITALS — BP 156/92 | HR 81 | Temp 98.7°F | Ht 64.0 in | Wt 167.0 lb

## 2020-05-24 DIAGNOSIS — Z1211 Encounter for screening for malignant neoplasm of colon: Secondary | ICD-10-CM | POA: Diagnosis not present

## 2020-05-24 DIAGNOSIS — Z1231 Encounter for screening mammogram for malignant neoplasm of breast: Secondary | ICD-10-CM | POA: Diagnosis not present

## 2020-05-24 DIAGNOSIS — M25561 Pain in right knee: Secondary | ICD-10-CM | POA: Diagnosis not present

## 2020-05-24 DIAGNOSIS — Z0000 Encounter for general adult medical examination without abnormal findings: Secondary | ICD-10-CM | POA: Diagnosis not present

## 2020-05-24 NOTE — Patient Instructions (Signed)
Health Maintenance, Female Adopting a healthy lifestyle and getting preventive care are important in promoting health and wellness. Ask your health care provider about:  The right schedule for you to have regular tests and exams.  Things you can do on your own to prevent diseases and keep yourself healthy. What should I know about diet, weight, and exercise? Eat a healthy diet   Eat a diet that includes plenty of vegetables, fruits, low-fat dairy products, and lean protein.  Do not eat a lot of foods that are high in solid fats, added sugars, or sodium. Maintain a healthy weight Body mass index (BMI) is used to identify weight problems. It estimates body fat based on height and weight. Your health care provider can help determine your BMI and help you achieve or maintain a healthy weight. Get regular exercise Get regular exercise. This is one of the most important things you can do for your health. Most adults should:  Exercise for at least 150 minutes each week. The exercise should increase your heart rate and make you sweat (moderate-intensity exercise).  Do strengthening exercises at least twice a week. This is in addition to the moderate-intensity exercise.  Spend less time sitting. Even light physical activity can be beneficial. Watch cholesterol and blood lipids Have your blood tested for lipids and cholesterol at 53 years of age, then have this test every 5 years. Have your cholesterol levels checked more often if:  Your lipid or cholesterol levels are high.  You are older than 53 years of age.  You are at high risk for heart disease. What should I know about cancer screening? Depending on your health history and family history, you may need to have cancer screening at various ages. This may include screening for:  Breast cancer.  Cervical cancer.  Colorectal cancer.  Skin cancer.  Lung cancer. What should I know about heart disease, diabetes, and high blood  pressure? Blood pressure and heart disease  High blood pressure causes heart disease and increases the risk of stroke. This is more likely to develop in people who have high blood pressure readings, are of African descent, or are overweight.  Have your blood pressure checked: ? Every 3-5 years if you are 18-39 years of age. ? Every year if you are 40 years old or older. Diabetes Have regular diabetes screenings. This checks your fasting blood sugar level. Have the screening done:  Once every three years after age 40 if you are at a normal weight and have a low risk for diabetes.  More often and at a younger age if you are overweight or have a high risk for diabetes. What should I know about preventing infection? Hepatitis B If you have a higher risk for hepatitis B, you should be screened for this virus. Talk with your health care provider to find out if you are at risk for hepatitis B infection. Hepatitis C Testing is recommended for:  Everyone born from 1945 through 1965.  Anyone with known risk factors for hepatitis C. Sexually transmitted infections (STIs)  Get screened for STIs, including gonorrhea and chlamydia, if: ? You are sexually active and are younger than 53 years of age. ? You are older than 53 years of age and your health care provider tells you that you are at risk for this type of infection. ? Your sexual activity has changed since you were last screened, and you are at increased risk for chlamydia or gonorrhea. Ask your health care provider if   you are at risk.  Ask your health care provider about whether you are at high risk for HIV. Your health care provider may recommend a prescription medicine to help prevent HIV infection. If you choose to take medicine to prevent HIV, you should first get tested for HIV. You should then be tested every 3 months for as long as you are taking the medicine. Pregnancy  If you are about to stop having your period (premenopausal) and  you may become pregnant, seek counseling before you get pregnant.  Take 400 to 800 micrograms (mcg) of folic acid every day if you become pregnant.  Ask for birth control (contraception) if you want to prevent pregnancy. Osteoporosis and menopause Osteoporosis is a disease in which the bones lose minerals and strength with aging. This can result in bone fractures. If you are 65 years old or older, or if you are at risk for osteoporosis and fractures, ask your health care provider if you should:  Be screened for bone loss.  Take a calcium or vitamin D supplement to lower your risk of fractures.  Be given hormone replacement therapy (HRT) to treat symptoms of menopause. Follow these instructions at home: Lifestyle  Do not use any products that contain nicotine or tobacco, such as cigarettes, e-cigarettes, and chewing tobacco. If you need help quitting, ask your health care provider.  Do not use street drugs.  Do not share needles.  Ask your health care provider for help if you need support or information about quitting drugs. Alcohol use  Do not drink alcohol if: ? Your health care provider tells you not to drink. ? You are pregnant, may be pregnant, or are planning to become pregnant.  If you drink alcohol: ? Limit how much you use to 0-1 drink a day. ? Limit intake if you are breastfeeding.  Be aware of how much alcohol is in your drink. In the [place] one drink equals one 12 oz bottle of beer (355 mL), one 5 oz glass of wine (148 mL), or one 1 oz glass of hard liquor (44 mL). General instructions  Schedule regular health, dental, and eye exams.  Stay current with your vaccines.  Tell your health care provider if: ? You often feel depressed. ? You have ever been abused or do not feel safe at home. Summary  Adopting a healthy lifestyle and getting preventive care are important in promoting health and wellness.  Follow your health care provider's instructions about healthy  diet, exercising, and getting tested or screened for diseases.  Follow your health care provider's instructions on monitoring your cholesterol and blood pressure. This information is not intended to replace advice given to you by your health care provider. Make sure you discuss any questions you have with your health care provider. Document Revised: 08/27/2018 Document Reviewed: 08/27/2018 [employer]vier Patient Education  2020 [employer]  

## 2020-05-24 NOTE — Progress Notes (Signed)
Complete physical exam   Patient: Heather Curry   DOB: 08-29-1966   54 y.o. Female  MRN: [id number] Visit Date: 05/24/2020  Today's healthcare provider: Trinna Post, PA-C   Chief Complaint  Patient presents with  . Annual Exam  I,Schwanda Zima M Shalamar Plourde,acting as a scribe for Trinna Post, PA-C.,have documented all relevant documentation on the behalf of Trinna Post, PA-C,as directed by  Trinna Post, PA-C while in the presence of Trinna Post, PA-C.   Subjective    Heather Curry is a 54 y.o. female who presents today for a complete physical exam.  She reports consuming a general and low sodium diet. Home exercise routine includes walking. She generally feels well. She reports sleeping fairly well. She does have additional problems to discuss today.  HPI   She reports she has right knee pain especially when going up stairs. She denies injuries. She reports pain improves with rest but does feel stiffness when getting up after a long period of immobility.   Past Medical History:  Diagnosis Date  . Colon polyps    Past Surgical History:  Procedure Laterality Date  . APPENDECTOMY    . BREAST BIOPSY Right 2007   neg  . BREAST SURGERY Left    Lumpectomy  . CERVIX REMOVAL    . COLONOSCOPY WITH PROPOFOL N/A 04/18/2018   Procedure: COLONOSCOPY WITH PROPOFOL;  Surgeon: Virgel Manifold, MD;  Location: [facility] ENDOSCOPY;  Service: Gastroenterology;  Laterality: N/A;  . COLONOSCOPY WITH PROPOFOL N/A 04/17/2019   Procedure: COLONOSCOPY WITH PROPOFOL;  Surgeon: Virgel Manifold, MD;  Location: [facility] ENDOSCOPY;  Service: Endoscopy;  Laterality: N/A;   Social History   Socioeconomic History  . Marital status: Single    Spouse name: Not on file  . Number of children: Not on file  . Years of education: Not on file  . Highest education level: Not on file  Occupational History  . Not on file  Tobacco Use  . Smoking status: Former Smoker    Quit date: 2009     Years since quitting: 12.7  . Smokeless tobacco: Never Used  Vaping Use  . Vaping Use: Never used  Substance and Sexual Activity  . Alcohol use: Yes    Comment: rarely  . Drug use: Never  . Sexual activity: Not on file  Other Topics Concern  . Not on file  Social History Narrative  . Not on file   Social Determinants of Health   Financial Resource Strain:   . Difficulty of Paying Living Expenses: Not on file  Food Insecurity:   . Worried About Charity fundraiser in the Last Year: Not on file  . Ran Out of Food in the Last Year: Not on file  Transportation Needs:   . Lack of Transportation (Medical): Not on file  . Lack of Transportation (Non-Medical): Not on file  Physical Activity:   . Days of Exercise per Week: Not on file  . Minutes of Exercise per Session: Not on file  Stress:   . Feeling of Stress : Not on file  Social Connections:   . Frequency of Communication with Friends and Family: Not on file  . Frequency of Social Gatherings with Friends and Family: Not on file  . Attends Religious Services: Not on file  . Active Member of Clubs or Organizations: Not on file  . Attends Archivist Meetings: Not on file  . Marital Status: Not on  file  Intimate Partner Violence:   . Fear of Current or Ex-Partner: Not on file  . Emotionally Abused: Not on file  . Physically Abused: Not on file  . Sexually Abused: Not on file   Family Status  Relation Name Status  . Mother  Alive  . Father  Deceased  . MGM  Deceased  . Sister  Alive  . Mat Aunt  (Not Specified)   Family History  Problem Relation Age of Onset  . Congestive Heart Failure Mother   . Hypertension Mother   . Arthritis Mother   . Alcohol abuse Father   . Hypertension Father   . COPD Father   . Arthritis Maternal Grandmother   . Cancer Maternal Grandmother   . Hypertension Sister   . Breast cancer Maternal Aunt 80   Allergies  Allergen Reactions  . Bee Venom Anaphylaxis  . Penicillins       Patient Care Team: Paulene Floor as PCP - General (Physician Assistant)   Medications: No facility-administered medications prior to visit.   No outpatient medications prior to visit.    Review of Systems  Constitutional: Positive for diaphoresis and fatigue.  HENT: Positive for mouth sores.   Eyes: Negative.   Respiratory: Negative.   Cardiovascular: Negative.   Gastrointestinal: Negative.   Endocrine: Negative.   Genitourinary: Negative.   Musculoskeletal: Positive for arthralgias, back pain and joint swelling.  Skin: Negative.   Neurological: Negative.   Hematological: Bruises/bleeds easily.  Psychiatric/Behavioral: Negative.       Objective    BP (!) 156/92 (BP Location: Left Arm, Patient Position: Sitting, Cuff Size: Normal)   Pulse 81   Temp 98.7 F (37.1 C) (Oral)   Ht 5\' 4"  (1.626 m)   Wt 167 lb (75.8 kg)   SpO2 97%   BMI 28.67 kg/m    Physical Exam Constitutional:      Appearance: Normal appearance.  HENT:     Right Ear: Tympanic membrane and ear canal normal.     Left Ear: Tympanic membrane and ear canal normal.  Cardiovascular:     Rate and Rhythm: Normal rate and regular rhythm.     Heart sounds: Normal heart sounds.  Pulmonary:     Effort: Pulmonary effort is normal.     Breath sounds: Normal breath sounds.  Abdominal:     General: Bowel sounds are normal.     Palpations: Abdomen is soft.  Musculoskeletal:     Cervical back: Normal range of motion and neck supple.     Right knee: Crepitus present. No swelling.     Left knee: Normal.  Lymphadenopathy:     Cervical: No cervical adenopathy.  Skin:    General: Skin is warm and dry.  Neurological:     Mental Status: She is alert and oriented to person, place, and time. Mental status is at baseline.  Psychiatric:        Mood and Affect: Mood normal.        Behavior: Behavior normal.       Last depression screening scores PHQ 2/9 Scores 05/24/2020 05/05/2019 02/17/2018  PHQ - 2  Score 2 0 0  PHQ- 9 Score 5 3 3    Last fall risk screening Fall Risk  05/24/2020  Falls in the past year? 0  Number falls in past yr: 0  Injury with Fall? 0   Last Audit-C alcohol use screening Alcohol Use Disorder Test (AUDIT) 05/24/2020  1. How often do you have a  drink containing alcohol? 0  2. How many drinks containing alcohol do you have on a typical day when you are drinking? 0  3. How often do you have six or more drinks on one occasion? 0  AUDIT-C Score 0   A score of 3 or more in women, and 4 or more in men indicates increased risk for alcohol abuse, EXCEPT if all of the points are from question 1   Results for orders placed or performed in visit on 05/24/20  CBC with Differential/Platelet  Result Value Ref Range   WBC 6.0 3.4 - 10.8 x10E3/uL   RBC 4.79 3.77 - 5.28 x10E6/uL   Hemoglobin 13.3 11.1 - 15.9 g/dL   Hematocrit 41.1 34.0 - 46.6 %   MCV 86 79 - 97 fL   MCH 27.8 26.6 - 33.0 pg   MCHC 32.4 31 - 35 g/dL   RDW 13.0 11.7 - 15.4 %   Platelets 265 150 - 450 x10E3/uL   Neutrophils 53 Not Estab. %   Lymphs 37 Not Estab. %   Monocytes 7 Not Estab. %   Eos 2 Not Estab. %   Basos 1 Not Estab. %   Neutrophils Absolute 3.2 1 - 7 x10E3/uL   Lymphocytes Absolute 2.2 0 - 3 x10E3/uL   Monocytes Absolute 0.4 0 - 0 x10E3/uL   EOS (ABSOLUTE) 0.1 0.0 - 0.4 x10E3/uL   Basophils Absolute 0.0 0 - 0 x10E3/uL   Immature Granulocytes 0 Not Estab. %   Immature Grans (Abs) 0.0 0.0 - 0.1 x10E3/uL  Comprehensive metabolic panel  Result Value Ref Range   Glucose 87 65 - 99 mg/dL   BUN 13 6 - 24 mg/dL   Creatinine, Ser 0.78 0.57 - 1.00 mg/dL   GFR calc non Af Amer 87 >59 mL/min/1.73   GFR calc Af Amer 100 >59 mL/min/1.73   BUN/Creatinine Ratio 17 9 - 23   Sodium 138 134 - 144 mmol/L   Potassium 4.5 3.5 - 5.2 mmol/L   Chloride 102 96 - 106 mmol/L   CO2 22 20 - 29 mmol/L   Calcium 10.0 8.7 - 10.2 mg/dL   Total Protein 7.1 6.0 - 8.5 g/dL   Albumin 4.5 3.8 - 4.9 g/dL   Globulin,  Total 2.6 1.5 - 4.5 g/dL   Albumin/Globulin Ratio 1.7 1.2 - 2.2   Bilirubin Total 0.3 0.0 - 1.2 mg/dL   Alkaline Phosphatase 137 (H) 48 - 121 IU/L   AST 20 0 - 40 IU/L   ALT 31 0 - 32 IU/L  Lipid panel  Result Value Ref Range   Cholesterol, Total 203 (H) 100 - 199 mg/dL   Triglycerides 63 0 - 149 mg/dL   HDL 38 (L) >39 mg/dL   VLDL Cholesterol Cal 11 5 - 40 mg/dL   LDL Chol Calc (NIH) 154 (H) 0 - 99 mg/dL   Chol/HDL Ratio 5.3 (H) 0.0 - 4.4 ratio  TSH  Result Value Ref Range   TSH 6.470 (H) 0.450 - 4.500 uIU/mL  Hepatitis C antibody  Result Value Ref Range   Hep C Virus Ab 0.1 0.0 - 0.9 s/co ratio  T4, free  Result Value Ref Range   Free T4 0.84 0.82 - 1.77 ng/dL  T3, free  Result Value Ref Range   T3, Free 2.8 2.0 - 4.4 pg/mL  Specimen status report  Result Value Ref Range   specimen status report Comment   T4  Result Value Ref Range   T4, Total 9.3  4.5 - 12.0 ug/dL  T3, free  Result Value Ref Range   T3, Free 2.8 2.0 - 4.4 pg/mL  Specimen status report  Result Value Ref Range   specimen status report Comment     Assessment & Plan    Routine Health Maintenance and Physical Exam  Exercise Activities and Dietary recommendations Goals   None     Immunization History  Administered Date(s) Administered  . Tdap 05/05/2019    Health Maintenance  Topic Date Due  . MAMMOGRAM  03/12/2020  . COLONOSCOPY  04/16/2020  . COVID-19 Vaccine (1) 06/17/2020 (Originally 09/21/1978)  . INFLUENZA VACCINE  12/15/2020 (Originally 04/17/2020)  . PAP SMEAR-Modifier  05/04/2024  . TETANUS/TDAP  05/04/2029  . Hepatitis C Screening  Completed  . HIV Screening  Completed    Discussed health benefits of physical activity, and encouraged her to engage in regular exercise appropriate for her age and condition.  1. Annual physical exam  - MM Digital Screening; Future - TSH - Lipid panel - Comprehensive metabolic panel - CBC with Differential/Platelet - Hepatitis C Antibody  2.  Encounter for screening mammogram for malignant neoplasm of breast  - MM Digital Screening; Future  3. Acute pain of right knee  Suspect arthritis. May use tylenol, lidocaine, sparing NSAID.   - DG Knee Complete 4 Views Right; Future  4. Colon cancer screening  Refer for colonoscopy.     No follow-ups on file.     ITrinna Post, PA-C, have reviewed all documentation for this visit. The documentation on 06/01/20 for the exam, diagnosis, procedures, and orders are all accurate and complete.  The entirety of the information documented in the History of Present Illness, Review of Systems and Physical Exam were personally obtained by me. Portions of this information were initially documented by [facility] and reviewed by me for thoroughness and accuracy.     Paulene Floor  [facility] [phone] (phone) [phone] (fax)  [place]

## 2020-05-25 LAB — LIPID PANEL
Chol/HDL Ratio: 5.3 ratio — ABNORMAL HIGH (ref 0.0–4.4)
Cholesterol, Total: 203 mg/dL — ABNORMAL HIGH (ref 100–199)
HDL: 38 mg/dL — ABNORMAL LOW (ref >39)
LDL Chol Calc (NIH): 154 mg/dL — ABNORMAL HIGH (ref 0–99)
Triglycerides: 63 mg/dL (ref 0–149)
VLDL Cholesterol Cal: 11 mg/dL (ref 5–40)

## 2020-05-25 LAB — CBC WITH DIFFERENTIAL/PLATELET
Basophils Absolute: 0 10*3/uL (ref 0.0–0.2)
Basos: 1 %
EOS (ABSOLUTE): 0.1 10*3/uL (ref 0.0–0.4)
Eos: 2 %
Hematocrit: 41.1 % (ref 34.0–46.6)
Hemoglobin: 13.3 g/dL (ref 11.1–15.9)
Immature Grans (Abs): 0 10*3/uL (ref 0.0–0.1)
Immature Granulocytes: 0 %
Lymphocytes Absolute: 2.2 10*3/uL (ref 0.7–3.1)
Lymphs: 37 %
MCH: 27.8 pg (ref 26.6–33.0)
MCHC: 32.4 g/dL (ref 31.5–35.7)
MCV: 86 fL (ref 79–97)
Monocytes Absolute: 0.4 10*3/uL (ref 0.1–0.9)
Monocytes: 7 %
Neutrophils Absolute: 3.2 10*3/uL (ref 1.4–7.0)
Neutrophils: 53 %
Platelets: 265 10*3/uL (ref 150–450)
RBC: 4.79 x10E6/uL (ref 3.77–5.28)
RDW: 13 % (ref 11.7–15.4)
WBC: 6 10*3/uL (ref 3.4–10.8)

## 2020-05-25 LAB — COMPREHENSIVE METABOLIC PANEL
ALT: 31 IU/L (ref 0–32)
AST: 20 IU/L (ref 0–40)
Albumin/Globulin Ratio: 1.7 (ref 1.2–2.2)
Albumin: 4.5 g/dL (ref 3.8–4.9)
Alkaline Phosphatase: 137 IU/L — ABNORMAL HIGH (ref 48–121)
BUN/Creatinine Ratio: 17 (ref 9–23)
BUN: 13 mg/dL (ref 6–24)
Bilirubin Total: 0.3 mg/dL (ref 0.0–1.2)
CO2: 22 mmol/L (ref 20–29)
Calcium: 10 mg/dL (ref 8.7–10.2)
Chloride: 102 mmol/L (ref 96–106)
Creatinine, Ser: 0.78 mg/dL (ref 0.57–1.00)
GFR calc Af Amer: 100 mL/min/{1.73_m2} (ref >59)
GFR calc non Af Amer: 87 mL/min/{1.73_m2} (ref >59)
Globulin, Total: 2.6 g/dL (ref 1.5–4.5)
Glucose: 87 mg/dL (ref 65–99)
Potassium: 4.5 mmol/L (ref 3.5–5.2)
Sodium: 138 mmol/L (ref 134–144)
Total Protein: 7.1 g/dL (ref 6.0–8.5)

## 2020-05-25 LAB — HEPATITIS C ANTIBODY: Hep C Virus Ab: 0.1 s/co ratio (ref 0.0–0.9)

## 2020-05-25 LAB — TSH: TSH: 6.47 u[IU]/mL — ABNORMAL HIGH (ref 0.450–4.500)

## 2020-05-30 ENCOUNTER — Other Ambulatory Visit
Admission: RE | Admit: 2020-05-30 | Discharge: 2020-05-30 | Disposition: A | Discharge status: Home / Self Care | Payer: Managed Care, Other (non HMO) | Source: Ambulatory Visit | Attending: Gastroenterology | Admitting: Gastroenterology

## 2020-05-30 ENCOUNTER — Other Ambulatory Visit: Payer: Self-pay

## 2020-05-30 ENCOUNTER — Telehealth: Payer: Self-pay

## 2020-05-30 DIAGNOSIS — Z20822 Contact with and (suspected) exposure to covid-19: Secondary | ICD-10-CM | POA: Insufficient documentation

## 2020-05-30 DIAGNOSIS — Z01812 Encounter for preprocedural laboratory examination: Secondary | ICD-10-CM | POA: Insufficient documentation

## 2020-05-30 NOTE — Telephone Encounter (Signed)
Spoke with patient in regards to not getting her bowel prep for her appt. Called patient to let her know I would be sending over that prescription but she informed me she really could not drink the Suprep. We offer her SuTab instead. Patient will pick up in the morning. She verbalized understanding.

## 2020-05-30 NOTE — Progress Notes (Signed)
Add on labs faxed to [facility].

## 2020-05-31 ENCOUNTER — Other Ambulatory Visit: Payer: Self-pay

## 2020-05-31 ENCOUNTER — Encounter: Payer: Self-pay | Admitting: Gastroenterology

## 2020-05-31 LAB — T3, FREE
T3, Free: 2.8 pg/mL (ref 2.0–4.4)
T3, Free: 2.8 pg/mL (ref 2.0–4.4)

## 2020-05-31 LAB — T4: T4, Total: 9.3 ug/dL (ref 4.5–12.0)

## 2020-05-31 LAB — T4, FREE: Free T4: 0.84 ng/dL (ref 0.82–1.77)

## 2020-05-31 LAB — SPECIMEN STATUS REPORT

## 2020-05-31 LAB — SARS CORONAVIRUS 2 (TAT 6-24 HRS): SARS Coronavirus 2: NEGATIVE

## 2020-05-31 MED ORDER — PEG 3350-KCL-NA BICARB-NACL 420 G PO SOLR: ORAL | 0 refills | Status: DC

## 2020-06-01 ENCOUNTER — Other Ambulatory Visit: Payer: Self-pay

## 2020-06-01 ENCOUNTER — Encounter: Payer: Self-pay | Admitting: Gastroenterology

## 2020-06-01 ENCOUNTER — Ambulatory Visit
Admission: RE | Admit: 2020-06-01 | Discharge: 2020-06-01 | Disposition: A | Discharge status: Home / Self Care | Payer: Managed Care, Other (non HMO) | Attending: Gastroenterology | Admitting: Gastroenterology

## 2020-06-01 ENCOUNTER — Encounter
Admission: RE | Disposition: A | Discharge status: Home / Self Care | Payer: Self-pay | Source: Home / Self Care | Attending: Gastroenterology

## 2020-06-01 ENCOUNTER — Ambulatory Visit: Payer: Managed Care, Other (non HMO) | Admitting: Anesthesiology

## 2020-06-01 DIAGNOSIS — Z880 Allergy status to penicillin: Secondary | ICD-10-CM | POA: Insufficient documentation

## 2020-06-01 DIAGNOSIS — Z87891 Personal history of nicotine dependence: Secondary | ICD-10-CM | POA: Insufficient documentation

## 2020-06-01 DIAGNOSIS — Z1211 Encounter for screening for malignant neoplasm of colon: Secondary | ICD-10-CM | POA: Diagnosis present

## 2020-06-01 DIAGNOSIS — Z86010 Personal history of colonic polyps: Secondary | ICD-10-CM | POA: Diagnosis not present

## 2020-06-01 DIAGNOSIS — D122 Benign neoplasm of ascending colon: Secondary | ICD-10-CM | POA: Insufficient documentation

## 2020-06-01 DIAGNOSIS — K635 Polyp of colon: Secondary | ICD-10-CM

## 2020-06-01 HISTORY — PX: COLONOSCOPY WITH PROPOFOL: SHX5780

## 2020-06-01 HISTORY — DX: Polyp of colon: K63.5

## 2020-06-01 SURGERY — COLONOSCOPY WITH PROPOFOL
Anesthesia: General

## 2020-06-01 MED ORDER — MIDAZOLAM HCL 2 MG/2ML IJ SOLN
INTRAMUSCULAR | Status: AC
  Filled 2020-06-01: qty 2

## 2020-06-01 MED ORDER — GLYCOPYRROLATE 0.2 MG/ML IJ SOLN
INTRAMUSCULAR | Status: DC | PRN
  Administered 2020-06-01: .2 mg via INTRAVENOUS

## 2020-06-01 MED ORDER — PHENYLEPHRINE HCL (PRESSORS) 10 MG/ML IV SOLN
INTRAVENOUS | Status: DC | PRN
  Administered 2020-06-01: 100 ug via INTRAVENOUS

## 2020-06-01 MED ORDER — SODIUM CHLORIDE 0.9 % IV SOLN: INTRAVENOUS | Status: DC

## 2020-06-01 MED ORDER — FENTANYL CITRATE (PF) 100 MCG/2ML IJ SOLN
INTRAMUSCULAR | Status: DC | PRN
  Administered 2020-06-01 (×2): 50 ug via INTRAVENOUS

## 2020-06-01 MED ORDER — PROPOFOL 10 MG/ML IV BOLUS
INTRAVENOUS | Status: DC | PRN
  Administered 2020-06-01: 50 mg via INTRAVENOUS

## 2020-06-01 MED ORDER — PROPOFOL 500 MG/50ML IV EMUL
INTRAVENOUS | Status: DC | PRN
  Administered 2020-06-01: 100 ug/kg/min via INTRAVENOUS

## 2020-06-01 MED ORDER — MIDAZOLAM HCL 2 MG/2ML IJ SOLN
INTRAMUSCULAR | Status: DC | PRN
  Administered 2020-06-01: 1 mg via INTRAVENOUS
  Administered 2020-06-01: 3 mg via INTRAVENOUS

## 2020-06-01 MED ORDER — FENTANYL CITRATE (PF) 100 MCG/2ML IJ SOLN
INTRAMUSCULAR | Status: AC
  Filled 2020-06-01: qty 2

## 2020-06-01 NOTE — Op Note (Signed)
[facility] Gastroenterology Patient Name: Aashna Matson Procedure Date: 06/01/2020 9:42 AM MRN: [id number] Account #: [id number] Date of Birth: 06/26/1966 Admit Type: Outpatient Age: 54 Room: [facility] ENDO ROOM 3 Gender: Female Note Status: Finalized Procedure:             Colonoscopy Indications:           High risk colon cancer surveillance: Personal history                         of colonic polyps Providers:             Ottis Sarnowski B. Bonna Gains MD, MD Referring MD:          Wendee Beavers. Terrilee Croak (Referring MD) Medicines:             Monitored Anesthesia Care Complications:         No immediate complications. Procedure:             Pre-Anesthesia Assessment:                        - ASA Grade Assessment: II - A patient with mild                         systemic disease.                        - Prior to the procedure, a History and Physical was                         performed, and patient medications, allergies and                         sensitivities were reviewed. The patient's tolerance                         of previous anesthesia was reviewed.                        - The risks and benefits of the procedure and the                         sedation options and risks were discussed with the                         patient. All questions were answered and informed                         consent was obtained.                        - Patient identification and proposed procedure were                         verified prior to the procedure by the physician, the                         nurse, the anesthesiologist, the anesthetist and the                         technician. The procedure was  verified in the                         procedure room.                        After obtaining informed consent, the colonoscope was                         passed under direct vision. Throughout the procedure,                         the patient's blood pressure, pulse, and  oxygen                         saturations were monitored continuously. The                         Colonoscope was introduced through the anus and                         advanced to the the cecum, identified by appendiceal                         orifice and ileocecal valve. The colonoscopy was                         performed with ease. The patient tolerated the                         procedure well. The quality of the bowel preparation                         was good. Findings:      The perianal and digital rectal examinations were normal.      A 7 mm polyp was found in the ascending colon at a site of previous       tattooing. The polyp was sessile. The polyp was removed with a cold       snare. Resection and retrieval were complete.      A 4 mm polyp was found in the ascending colon. The polyp was sessile.       The polyp was removed with a cold snare. Resection and retrieval were       complete.      A 2 mm polyp was found in the ascending colon. The polyp was sessile.       The polyp was removed with a cold biopsy forceps. Resection and       retrieval were complete.      The exam was otherwise without abnormality.      The rectum, sigmoid colon, descending colon, transverse colon, ascending       colon and cecum appeared normal.      The retroflexed view of the distal rectum and anal verge was normal and       showed no anal or rectal abnormalities. Impression:            - One 7 mm polyp in the ascending colon, removed with  a cold snare. Resected and retrieved.                        - One 4 mm polyp in the ascending colon, removed with                         a cold snare. Resected and retrieved.                        - One 2 mm polyp in the ascending colon, removed with                         a cold biopsy forceps. Resected and retrieved.                        - The examination was otherwise normal.                        - The rectum, sigmoid  colon, descending colon,                         transverse colon, ascending colon and cecum are normal.                        - The distal rectum and anal verge are normal on                         retroflexion view. Recommendation:        - Await pathology results.                        - Discharge patient to home (with escort).                        - Advance diet as tolerated.                        - Continue present medications.                        - Repeat colonoscopy date to be determined after                         pending pathology results are reviewed.                        - The findings and recommendations were discussed with                         the patient.                        - The findings and recommendations were discussed with                         the patient's family.                        - Return to primary care physician as previously  scheduled. Procedure Code(s):     --- Professional ---                        [phone], Colonoscopy, flexible; with removal of                         tumor(s), polyp(s), or other lesion(s) by snare                         technique                        45380, 89, Colonoscopy, flexible; with biopsy, single                         or multiple Diagnosis Code(s):     --- Professional ---                        Z86.010, Personal history of colonic polyps                        K63.5, Polyp of colon CPT copyright 2019 American Medical Association. All rights reserved. The codes documented in this report are preliminary and upon coder review may  be revised to meet current compliance requirements.  Vonda Antigua, MD Margretta Sidle B. Bonna Gains MD, MD 06/01/2020 10:50:20 AM This report has been signed electronically. Number of Addenda: 0 Note Initiated On: 06/01/2020 9:42 AM Scope Withdrawal Time: 0 hours 30 minutes 33 seconds  Total Procedure Duration: 0 hours 37 minutes 10 seconds  Estimated  Blood Loss:  Estimated blood loss: none.      [facility]

## 2020-06-01 NOTE — H&P (Signed)
Vonda Antigua, MD [address] [place], [place], [place], [address] [address], [place], [place], [place], [address] Phone: [phone]  Fax: [phone]  Primary Care Physician:  Trinna Post, PA-C   Pre-Procedure History & Physical: HPI:  Heather Curry is a 54 y.o. female is here for a colonoscopy.   Past Medical History:  Diagnosis Date  . Colon polyps     Past Surgical History:  Procedure Laterality Date  . APPENDECTOMY    . BREAST BIOPSY Right 2007   neg  . BREAST SURGERY Left    Lumpectomy  . CERVIX REMOVAL    . COLONOSCOPY WITH PROPOFOL N/A 04/18/2018   Procedure: COLONOSCOPY WITH PROPOFOL;  Surgeon: Virgel Manifold, MD;  Location: [facility] ENDOSCOPY;  Service: Gastroenterology;  Laterality: N/A;  . COLONOSCOPY WITH PROPOFOL N/A 04/17/2019   Procedure: COLONOSCOPY WITH PROPOFOL;  Surgeon: Virgel Manifold, MD;  Location: [facility] ENDOSCOPY;  Service: Endoscopy;  Laterality: N/A;    Prior to Admission medications   Medication Sig Start Date End Date Taking? Authorizing Provider  polyethylene glycol-electrolytes (NULYTELY) 420 g solution Prepare according to package instructions. Starting at 5:00 PM: Drink one 8 oz glass of mixture every 15 minutes until you finish half of the jug. Five hours prior to procedure, drink 8 oz glass of mixture every 15 minutes until it is all gone. Make sure you do not drink anything 4 hours prior to your procedure. 05/31/20   Virgel Manifold, MD    Allergies as of 05/18/2020 - Review Complete 05/05/2019  Allergen Reaction Noted  . Bee venom Anaphylaxis 02/14/2018  . Penicillins  04/17/2019    Family History  Problem Relation Age of Onset  . Congestive Heart Failure Mother   . Hypertension Mother   . Arthritis Mother   . Alcohol abuse Father   . Hypertension Father   . COPD Father   . Arthritis Maternal Grandmother   . Cancer Maternal Grandmother   . Hypertension Sister   . Breast cancer Maternal Aunt 80      Social History   Socioeconomic History  . Marital status: Single    Spouse name: Not on file  . Number of children: Not on file  . Years of education: Not on file  . Highest education level: Not on file  Occupational History  . Not on file  Tobacco Use  . Smoking status: Former Smoker    Quit date: 2009    Years since quitting: 12.7  . Smokeless tobacco: Never Used  Vaping Use  . Vaping Use: Never used  Substance and Sexual Activity  . Alcohol use: Yes    Comment: rarely  . Drug use: Never  . Sexual activity: Not on file  Other Topics Concern  . Not on file  Social History Narrative  . Not on file   Social Determinants of Health   Financial Resource Strain:   . Difficulty of Paying Living Expenses: Not on file  Food Insecurity:   . Worried About Charity fundraiser in the Last Year: Not on file  . Ran Out of Food in the Last Year: Not on file  Transportation Needs:   . Lack of Transportation (Medical): Not on file  . Lack of Transportation (Non-Medical): Not on file  Physical Activity:   . Days of Exercise per Week: Not on file  . Minutes of Exercise per Session: Not on file  Stress:   . Feeling of Stress : Not on file  Social Connections:   . Frequency of Communication with Friends and Family: Not on file  . Frequency of Social Gatherings with Friends and Family: Not on file  . Attends Religious Services: Not on file  . Active Member of Clubs or Organizations: Not on file  . Attends Archivist Meetings: Not on file  . Marital Status: Not on file  Intimate Partner Violence:   . Fear of Current or Ex-Partner: Not on file  . Emotionally Abused: Not on file  . Physically Abused: Not on file  . Sexually Abused: Not on file    Review of Systems: See HPI, otherwise negative ROS  Physical Exam: BP (!) 136/93   Pulse 64   Temp (!) 96.6 F (35.9 C) (Temporal)   Resp 18   Ht 5\' 4"  (1.626 m)   Wt 72.6 kg   SpO2 100%   BMI 27.46 kg/m  General:    Alert,  pleasant and cooperative in NAD Head:  Normocephalic and atraumatic. Neck:  Supple; no masses or thyromegaly. Lungs:  Clear throughout to auscultation, normal respiratory effort.    Heart:  +S1, +S2, Regular rate and rhythm, No edema. Abdomen:  Soft, nontender and nondistended. Normal bowel sounds, without guarding, and without rebound.   Neurologic:  Alert and  oriented x4;  grossly normal neurologically.  Impression/Plan: Heather Curry is here for a colonoscopy to be performed for history of polyps.  Risks, benefits, limitations, and alternatives regarding  colonoscopy have been reviewed with the patient.  Questions have been answered.  All parties agreeable.   Virgel Manifold, MD  06/01/2020, 9:53 AM

## 2020-06-01 NOTE — Transfer of Care (Signed)
Immediate Anesthesia Transfer of Care Note  Patient: Heather Curry  Procedure(s) Performed: COLONOSCOPY WITH PROPOFOL (N/A )  Patient Location: PACU  Anesthesia Type:General  Level of Consciousness: awake, alert  and oriented  Airway & Oxygen Therapy: Patient Spontanous Breathing and Patient connected to nasal cannula oxygen  Post-op Assessment: Report given to RN, Post -op Vital signs reviewed and stable and Patient moving all extremities  Post vital signs: Reviewed and stable  Last Vitals:  Vitals Value Taken Time  BP 99/66 06/01/20 1042  Temp    Pulse 63 06/01/20 1042  Resp 12 06/01/20 1042  SpO2 100 % 06/01/20 1042  Vitals shown include unvalidated device data.  Last Pain:  Vitals:   06/01/20 1042  TempSrc:   PainSc: Asleep         Complications: No complications documented.

## 2020-06-01 NOTE — Anesthesia Preprocedure Evaluation (Signed)
Anesthesia Evaluation  Patient identified by MRN, date of birth, ID band Patient awake    Reviewed: Allergy & Precautions, H&P , NPO status , Patient's Chart, lab work & pertinent test results, reviewed documented beta blocker date and time   Airway Mallampati: II   Neck ROM: full    Dental  (+) Poor Dentition   Pulmonary neg pulmonary ROS,    Pulmonary exam normal        Cardiovascular Exercise Tolerance: Good negative cardio ROS Normal cardiovascular exam Rhythm:regular Rate:Normal     Neuro/Psych negative neurological ROS  negative psych ROS   GI/Hepatic negative GI ROS, Neg liver ROS,   Endo/Other  negative endocrine ROS  Renal/GU negative Renal ROS  negative genitourinary   Musculoskeletal   Abdominal   Peds  Hematology negative hematology ROS (+)   Anesthesia Other Findings History reviewed. No pertinent past medical history. Past Surgical History: No date: APPENDECTOMY 2007: BREAST BIOPSY; Right     Comment:  neg No date: BREAST SURGERY; Left     Comment:  Lumpectomy No date: CERVIX REMOVAL 04/18/2018: COLONOSCOPY WITH PROPOFOL; N/A     Comment:  Procedure: COLONOSCOPY WITH PROPOFOL;  Surgeon:               Virgel Manifold, MD;  Location: [facility];                Service: Gastroenterology;  Laterality: N/A; 04/17/2019: COLONOSCOPY WITH PROPOFOL; N/A     Comment:  Procedure: COLONOSCOPY WITH PROPOFOL;  Surgeon:               Virgel Manifold, MD;  Location: [facility];                Service: Endoscopy;  Laterality: N/A;   Reproductive/Obstetrics negative OB ROS                             Anesthesia Physical Anesthesia Plan  ASA: II  Anesthesia Plan: General   Post-op Pain Management:    Induction:   PONV Risk Score and Plan:   Airway Management Planned:   Additional Equipment:   Intra-op Plan:   Post-operative Plan:   Informed Consent: I  have reviewed the patients History and Physical, chart, labs and discussed the procedure including the risks, benefits and alternatives for the proposed anesthesia with the patient or authorized representative who has indicated his/her understanding and acceptance.     Dental Advisory Given  Plan Discussed with: CRNA  Anesthesia Plan Comments:         Anesthesia Quick Evaluation

## 2020-06-02 LAB — SURGICAL PATHOLOGY

## 2020-06-02 NOTE — Anesthesia Postprocedure Evaluation (Signed)
Anesthesia Post Note  Patient: Heather Curry  Procedure(s) Performed: COLONOSCOPY WITH PROPOFOL (N/A )  Patient location during evaluation: PACU Anesthesia Type: General Level of consciousness: awake and alert Pain management: pain level controlled Vital Signs Assessment: post-procedure vital signs reviewed and stable Respiratory status: spontaneous breathing, nonlabored ventilation, respiratory function stable and patient connected to nasal cannula oxygen Cardiovascular status: blood pressure returned to baseline and stable Postop Assessment: no apparent nausea or vomiting Anesthetic complications: no   No complications documented.   Last Vitals:  Vitals:   06/01/20 1052 06/01/20 1056  BP:  91/69  Pulse: 68 62  Resp: 14 13  Temp:    SpO2: 100% 99%    Last Pain:  Vitals:   06/02/20 0739  TempSrc:   PainSc: 0-No pain                 Molli Barrows

## 2020-06-08 ENCOUNTER — Encounter: Payer: Self-pay | Admitting: Gastroenterology

## 2020-08-31 ENCOUNTER — Other Ambulatory Visit: Payer: Self-pay

## 2020-08-31 ENCOUNTER — Ambulatory Visit
Admission: RE | Admit: 2020-08-31 | Discharge: 2020-08-31 | Disposition: A | Discharge status: Home / Self Care | Payer: Managed Care, Other (non HMO) | Source: Ambulatory Visit | Attending: Physician Assistant | Admitting: Physician Assistant

## 2020-08-31 DIAGNOSIS — Z1231 Encounter for screening mammogram for malignant neoplasm of breast: Secondary | ICD-10-CM | POA: Diagnosis not present

## 2020-08-31 DIAGNOSIS — Z0000 Encounter for general adult medical examination without abnormal findings: Secondary | ICD-10-CM

## 2020-09-05 ENCOUNTER — Other Ambulatory Visit: Payer: Self-pay | Admitting: Physician Assistant

## 2020-09-05 DIAGNOSIS — R928 Other abnormal and inconclusive findings on diagnostic imaging of breast: Secondary | ICD-10-CM

## 2020-09-19 ENCOUNTER — Other Ambulatory Visit: Payer: Self-pay

## 2020-09-19 ENCOUNTER — Ambulatory Visit
Admission: RE | Admit: 2020-09-19 | Discharge: 2020-09-19 | Disposition: A | Discharge status: Home / Self Care | Payer: Managed Care, Other (non HMO) | Source: Ambulatory Visit | Attending: Physician Assistant | Admitting: Physician Assistant

## 2020-09-19 ENCOUNTER — Other Ambulatory Visit: Payer: Self-pay | Admitting: Physician Assistant

## 2020-09-19 DIAGNOSIS — R928 Other abnormal and inconclusive findings on diagnostic imaging of breast: Secondary | ICD-10-CM

## 2020-09-19 DIAGNOSIS — R921 Mammographic calcification found on diagnostic imaging of breast: Secondary | ICD-10-CM

## 2020-09-27 ENCOUNTER — Ambulatory Visit
Admission: RE | Admit: 2020-09-27 | Discharge: 2020-09-27 | Disposition: A | Discharge status: Home / Self Care | Payer: Managed Care, Other (non HMO) | Source: Ambulatory Visit | Attending: Physician Assistant | Admitting: Physician Assistant

## 2020-09-27 ENCOUNTER — Other Ambulatory Visit: Payer: Self-pay

## 2020-09-27 DIAGNOSIS — R921 Mammographic calcification found on diagnostic imaging of breast: Secondary | ICD-10-CM

## 2020-09-27 DIAGNOSIS — R928 Other abnormal and inconclusive findings on diagnostic imaging of breast: Secondary | ICD-10-CM | POA: Insufficient documentation

## 2020-09-27 HISTORY — PX: BREAST BIOPSY: SHX20

## 2020-09-28 LAB — SURGICAL PATHOLOGY

## 2021-02-03 IMAGING — MG MM BREAST BX W/ LOC DEV 1ST LESION IMAGE BX SPEC STEREO GUIDE*R*
7 of 14 series · 7 of 22 positions shown · non-contrast
Comparison: Previous exams.
COMPARISON: Previous exams.

Addendum:
CLINICAL DATA: 55-year-old a screening detected indeterminate 5 mm
group of amorphous calcifications in the UPPER RIGHT breast at
POSTERIOR depth.

EXAM:
RIGHT BREAST STEREOTACTIC CORE NEEDLE BIOPSY

[R (1 of 7)]
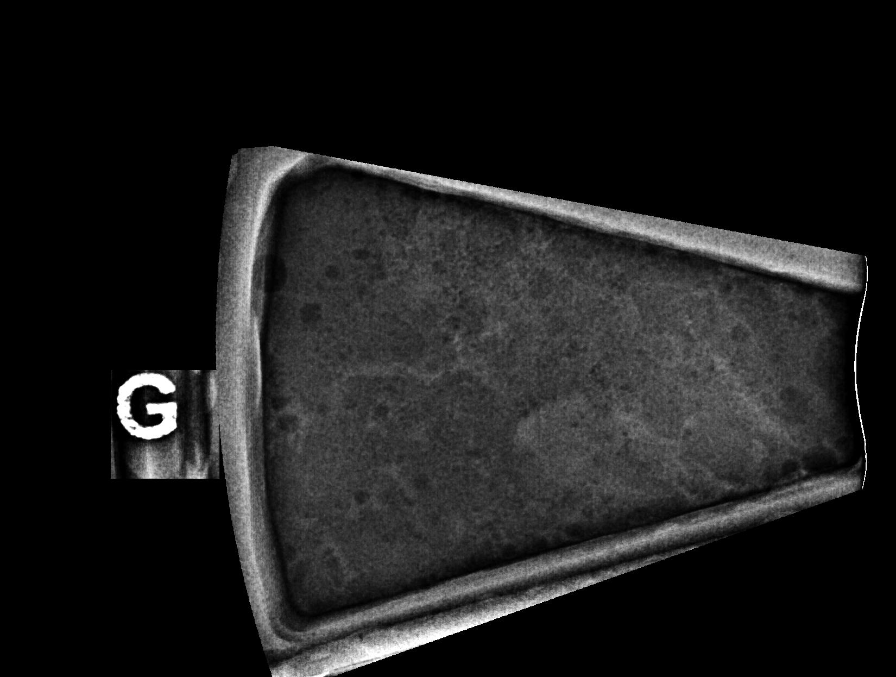

[R (2 of 7)]
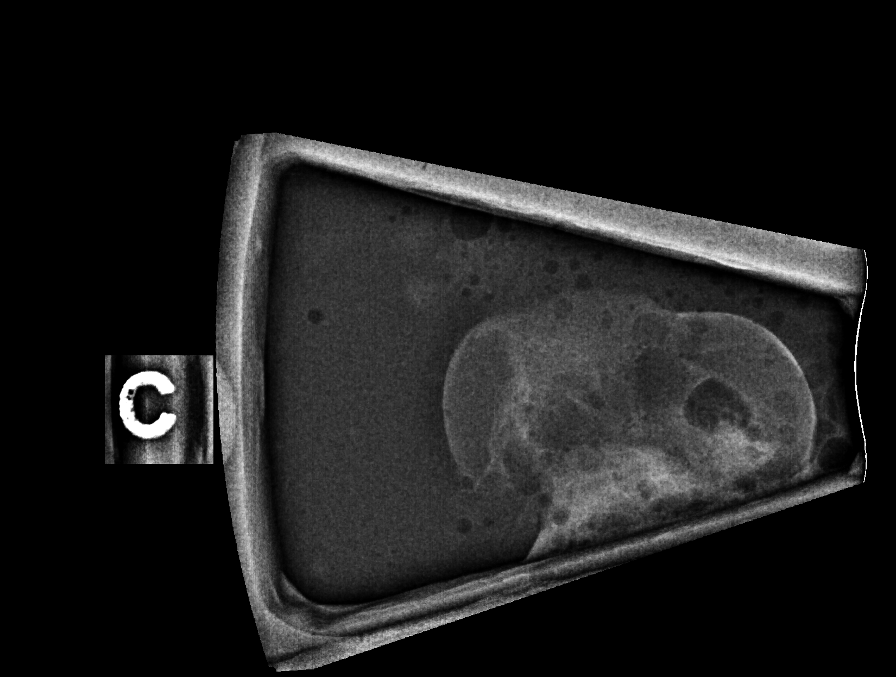

[R (3 of 7)]
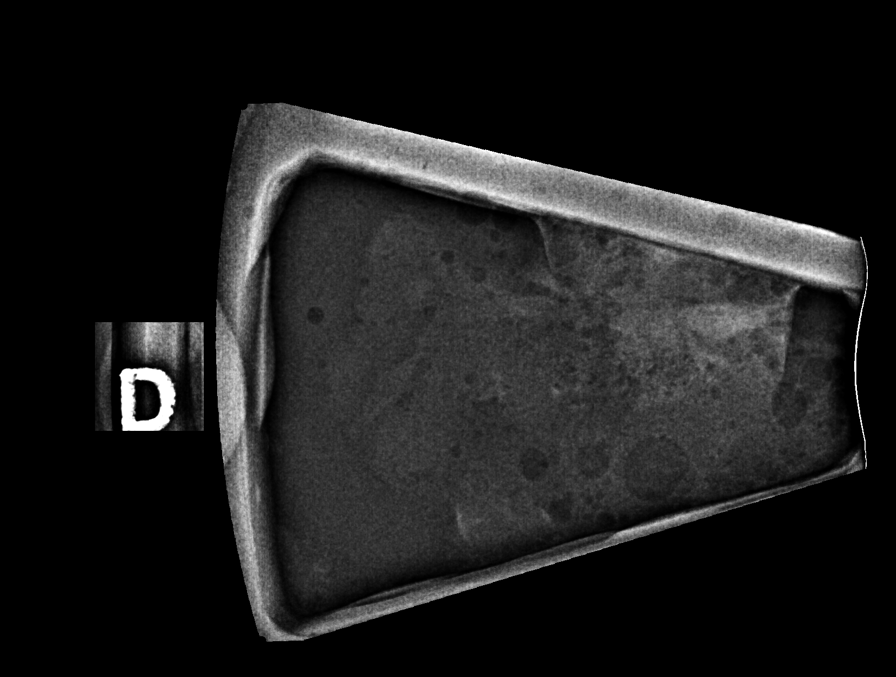

[R (4 of 7)]
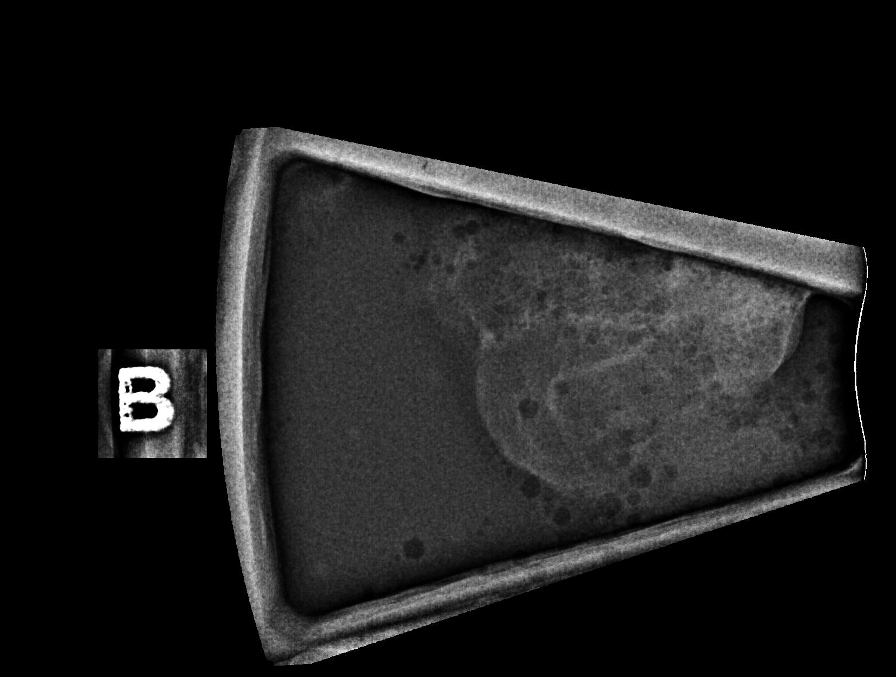

[R (5 of 7)]
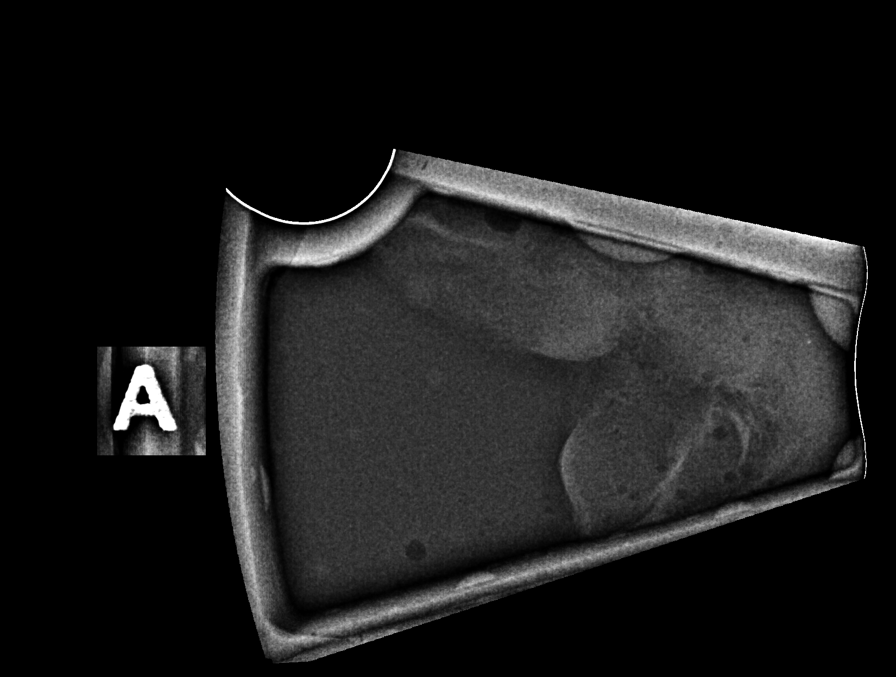

[R (6 of 7)]
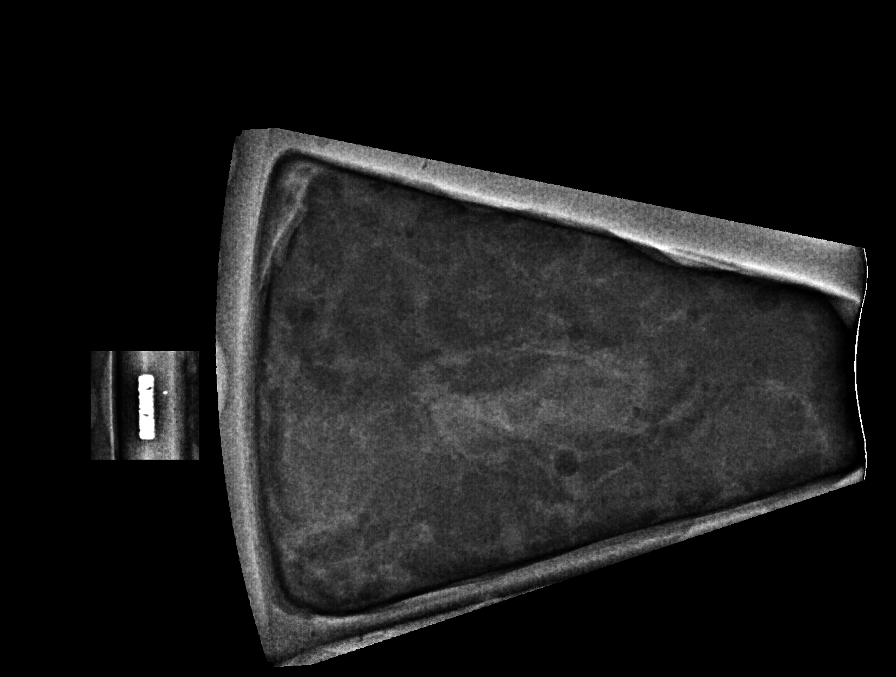

[R (7 of 7)]
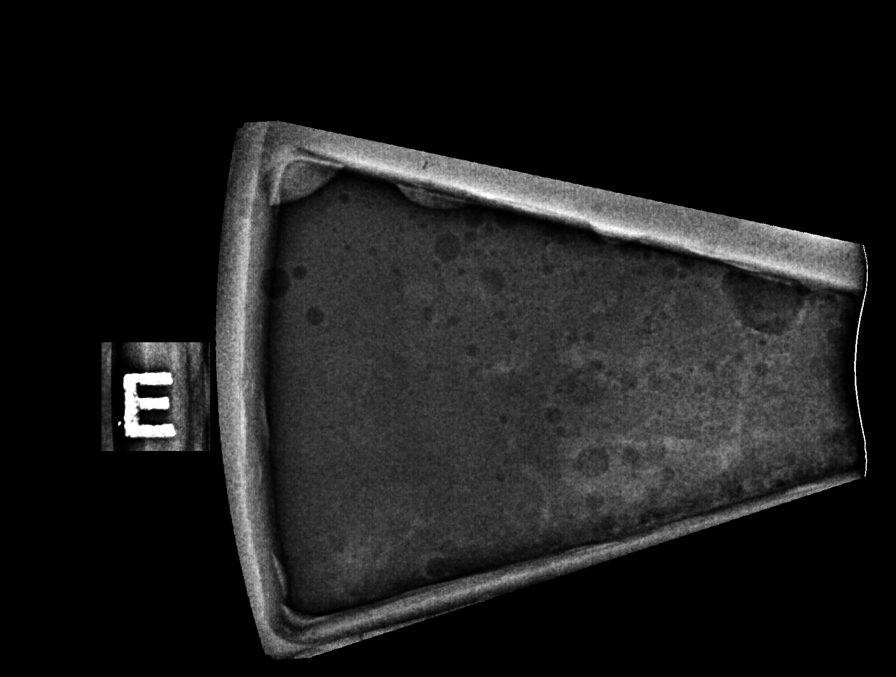

[7 of 22 positions shown; findings below may reference images not displayed]



Lesion quadrant: UPPER breast at POSTERIOR depth, near 12 o'clock
location.

Using sterile technique with chlorhexidine as skin antisepsis, 1%
lidocaine and 1% lidocaine with epinephrine as local anesthetic,
under stereotactic guidance, a 9 gauge Brevera vacuum assisted
device was used to perform core needle biopsy of calcifications
involving the UPPER RIGHT breast at POSTERIOR depth using a superior
approach. Specimen radiograph was performed showing faint
calcifications in 3 or 4 core samples. Specimens with calcifications
are identified for pathology.

At the conclusion of the procedure, an X shaped tissue marker clip
was deployed into the biopsy cavity. Follow-up 2-view mammogram was
performed and dictated separately.
IMPRESSION: Stereotactic-guided biopsy of faint amorphous calcifications
involving the UPPER RIGHT breast at POSTERIOR depth. No apparent
complications.

ADDENDUM:
PATHOLOGY revealed: A. BREAST, RIGHT UPPER POSTERIOR; STEREOTACTIC
CORE NEEDLE BIOPSY: - BENIGN MAMMARY PARENCHYMA WITH DENSE STROMAL
FIBROSIS, PSEUDO-ANGIOMATOUS STROMAL HYPERPLASIA, AND FOCAL
ASSOCIATED MICROCALCIFICATIONS. - NEGATIVE FOR ATYPICAL
PROLIFERATIVE BREAST DISEASE.

Pathology results are CONCORDANT with imaging findings, per Dr.
Ikramita Rimo.

Pathology results and recommendations below were discussed with
patient by telephone on 09/29/2020. Patient reported no issues at the
biopsy site apart from mild tenderness. Post biopsy care
instructions were reviewed, questions were answered and my direct
phone number was provided to patient. Patient was instructed to call
[HOSPITAL] if any concerns or questions arise related to
the biopsy.

Recommendation: Patient instructed to return in six months for
unilateral RIGHT breast diagnostic mammogram to assess stability of
biopsied area. Patient was informed a reminder notice will be sent
regarding this appointment.

Pathology results reported by Mirsalam Dav RN on 09/29/2020.



Lesion quadrant: UPPER breast at POSTERIOR depth, near 12 o'clock
location.

Using sterile technique with chlorhexidine as skin antisepsis, 1%
lidocaine and 1% lidocaine with epinephrine as local anesthetic,
under stereotactic guidance, a 9 gauge Brevera vacuum assisted
device was used to perform core needle biopsy of calcifications
involving the UPPER RIGHT breast at POSTERIOR depth using a superior
approach. Specimen radiograph was performed showing faint
calcifications in 3 or 4 core samples. Specimens with calcifications
are identified for pathology.

At the conclusion of the procedure, an X shaped tissue marker clip
was deployed into the biopsy cavity. Follow-up 2-view mammogram was
performed and dictated separately.
IMPRESSION: Stereotactic-guided biopsy of faint amorphous calcifications
involving the UPPER RIGHT breast at POSTERIOR depth. No apparent
complications.

## 2021-04-05 ENCOUNTER — Telehealth: Payer: Self-pay

## 2021-04-05 NOTE — Telephone Encounter (Signed)
Copied from [place] [id number]. Topic: Appointment Scheduling - Scheduling Inquiry for Clinic >> Apr 05, 2021 10:26 AM Scherrie Gerlach wrote: Reason for CRM: pt needs CPE after 9/7 .  Unable to schedule anything.  Please asvise

## 2021-04-05 NOTE — Telephone Encounter (Signed)
Appt with Vernie Murders, PA-C on 05/30/21 for CPE

## 2021-05-30 ENCOUNTER — Encounter: Payer: Managed Care, Other (non HMO) | Admitting: Family Medicine

## 2021-06-27 ENCOUNTER — Other Ambulatory Visit: Payer: Self-pay

## 2021-06-27 ENCOUNTER — Encounter: Payer: Self-pay | Admitting: Family Medicine

## 2021-06-27 ENCOUNTER — Ambulatory Visit (INDEPENDENT_AMBULATORY_CARE_PROVIDER_SITE_OTHER): Payer: Managed Care, Other (non HMO) | Admitting: Family Medicine

## 2021-06-27 VITALS — BP 133/95 | HR 73 | Temp 97.7°F | Ht 64.0 in | Wt 165.6 lb

## 2021-06-27 DIAGNOSIS — I10 Essential (primary) hypertension: Secondary | ICD-10-CM | POA: Insufficient documentation

## 2021-06-27 DIAGNOSIS — Z13220 Encounter for screening for lipoid disorders: Secondary | ICD-10-CM | POA: Insufficient documentation

## 2021-06-27 DIAGNOSIS — R030 Elevated blood-pressure reading, without diagnosis of hypertension: Secondary | ICD-10-CM | POA: Insufficient documentation

## 2021-06-27 DIAGNOSIS — Z136 Encounter for screening for cardiovascular disorders: Secondary | ICD-10-CM

## 2021-06-27 DIAGNOSIS — Z0000 Encounter for general adult medical examination without abnormal findings: Secondary | ICD-10-CM

## 2021-06-27 DIAGNOSIS — R5382 Chronic fatigue, unspecified: Secondary | ICD-10-CM | POA: Insufficient documentation

## 2021-06-27 DIAGNOSIS — I83813 Varicose veins of bilateral lower extremities with pain: Secondary | ICD-10-CM | POA: Insufficient documentation

## 2021-06-27 DIAGNOSIS — M25561 Pain in right knee: Secondary | ICD-10-CM | POA: Insufficient documentation

## 2021-06-27 DIAGNOSIS — F329 Major depressive disorder, single episode, unspecified: Secondary | ICD-10-CM | POA: Insufficient documentation

## 2021-06-27 DIAGNOSIS — Z131 Encounter for screening for diabetes mellitus: Secondary | ICD-10-CM | POA: Insufficient documentation

## 2021-06-27 MED ORDER — HYDROCHLOROTHIAZIDE 25 MG PO TABS: 25.0000 mg | ORAL_TABLET | Freq: Every day | ORAL | 3 refills | Status: DC

## 2021-06-27 NOTE — Assessment & Plan Note (Signed)
Referral placed; no exam completed today

## 2021-06-27 NOTE — Assessment & Plan Note (Signed)
New dx; pt endorses family hx Start on hctz per her preference

## 2021-06-27 NOTE — Assessment & Plan Note (Signed)
recommend diet low in saturated fat and regular exercise - 30 min at least 5 times per week Screening lipid panel today

## 2021-06-27 NOTE — Assessment & Plan Note (Signed)
Occasionally swelling; doing well with home exercises

## 2021-06-27 NOTE — Assessment & Plan Note (Signed)

## 2021-06-27 NOTE — Assessment & Plan Note (Signed)
Screening a1c today given age and BMI

## 2021-06-27 NOTE — Progress Notes (Signed)
Complete physical exam   Patient: Heather Curry   DOB: 07-06-1966   55 y.o. Female  MRN: [id number] Visit Date: 06/27/2021  Today's healthcare provider: Gwyneth Sprout, FNP   Chief Complaint  Patient presents with   Annual Exam   Up to date on dentist and eye appts  Subjective    Heather Curry is a 55 y.o. female who presents today for a complete physical exam.  She reports consuming a general diet. The patient does not participate in regular exercise at present. She generally feels fairly well. She reports sleeping well. She does have additional problems to discuss today. Skin irritation on lower abdomen. Varicose veins  HPI  Patient report of recent   Past Medical History:  Diagnosis Date   Colon polyps    Past Surgical History:  Procedure Laterality Date   APPENDECTOMY     BREAST BIOPSY Right 2007   neg   BREAST BIOPSY Right 09/27/2020   affirm breast bx, x marker, path pending   BREAST SURGERY Left    Lumpectomy   CERVIX REMOVAL     COLONOSCOPY WITH PROPOFOL N/A 04/18/2018   Procedure: COLONOSCOPY WITH PROPOFOL;  Surgeon: Virgel Manifold, MD;  Location: [facility];  Service: Gastroenterology;  Laterality: N/A;   COLONOSCOPY WITH PROPOFOL N/A 04/17/2019   Procedure: COLONOSCOPY WITH PROPOFOL;  Surgeon: Virgel Manifold, MD;  Location: [facility];  Service: Endoscopy;  Laterality: N/A;   COLONOSCOPY WITH PROPOFOL N/A 06/01/2020   Procedure: COLONOSCOPY WITH PROPOFOL;  Surgeon: Virgel Manifold, MD;  Location: [facility];  Service: Endoscopy;  Laterality: N/A;   Social History   Socioeconomic History   Marital status: Single    Spouse name: Not on file   Number of children: Not on file   Years of education: Not on file   Highest education level: Not on file  Occupational History   Not on file  Tobacco Use   Smoking status: Former    Types: Cigarettes    Quit date: 2009    Years since quitting: 13.7   Smokeless tobacco: Never   Vaping Use   Vaping Use: Never used  Substance and Sexual Activity   Alcohol use: Yes    Comment: rarely   Drug use: Never   Sexual activity: Not on file  Other Topics Concern   Not on file  Social History Narrative   Not on file   Social Determinants of Health   Financial Resource Strain: Not on file  Food Insecurity: Not on file  Transportation Needs: Not on file  Physical Activity: Not on file  Stress: Not on file  Social Connections: Not on file  Intimate Partner Violence: Not on file   Family Status  Relation Name Status   Mother  Alive   Father  Deceased   MGM  Deceased   Sister  Alive   [place]  (Not Specified)   Family History  Problem Relation Age of Onset   Congestive Heart Failure Mother    Hypertension Mother    Arthritis Mother    Alcohol abuse Father    Hypertension Father    COPD Father    Arthritis Maternal Grandmother    Cancer Maternal Grandmother    Hypertension Sister    Breast cancer Maternal Aunt 81   Allergies  Allergen Reactions   Bee Venom Anaphylaxis   Penicillins     Patient Care Team: Gwyneth Sprout, FNP as PCP - General (Family  Medicine)   Medications: Outpatient Medications Prior to Visit  Medication Sig   [DISCONTINUED] polyethylene glycol-electrolytes (NULYTELY) 420 g solution Prepare according to package instructions. Starting at 5:00 PM: Drink one 8 oz glass of mixture every 15 minutes until you finish half of the jug. Five hours prior to procedure, drink 8 oz glass of mixture every 15 minutes until it is all gone. Make sure you do not drink anything 4 hours prior to your procedure.   No facility-administered medications prior to visit.    Review of Systems  All other systems reviewed and are negative.    Objective    BP (!) 133/95   Pulse 73   Temp 97.7 F (36.5 C)   Ht 5\' 4"  (1.626 m)   Wt 165 lb 9.6 oz (75.1 kg)   SpO2 100%   BMI 28.43 kg/m    Physical Exam Vitals and nursing note reviewed.   Constitutional:      General: She is awake. She is not in acute distress.    Appearance: Normal appearance. She is well-developed, well-groomed and overweight. She is not ill-appearing, toxic-appearing or diaphoretic.  HENT:     Head: Normocephalic and atraumatic.     Jaw: There is normal jaw occlusion. No trismus, tenderness, swelling or pain on movement.     Right Ear: Hearing, tympanic membrane, ear canal and external ear normal. There is no impacted cerumen.     Left Ear: Hearing, tympanic membrane, ear canal and external ear normal. There is no impacted cerumen.     Nose: Nose normal. No congestion or rhinorrhea.     Right Turbinates: Not enlarged, swollen or pale.     Left Turbinates: Not enlarged, swollen or pale.     Right Sinus: No maxillary sinus tenderness or frontal sinus tenderness.     Left Sinus: No maxillary sinus tenderness or frontal sinus tenderness.     Mouth/Throat:     Lips: Pink.     Mouth: Mucous membranes are moist. No injury.     Tongue: No lesions.     Pharynx: Oropharynx is clear. Uvula midline. No pharyngeal swelling, oropharyngeal exudate, posterior oropharyngeal erythema or uvula swelling.     Tonsils: No tonsillar exudate or tonsillar abscesses.  Eyes:     General: Lids are normal. Lids are everted, no foreign bodies appreciated. Vision grossly intact. Gaze aligned appropriately. No allergic shiner or visual field deficit.       Right eye: No discharge.        Left eye: No discharge.     Extraocular Movements: Extraocular movements intact.     Conjunctiva/sclera: Conjunctivae normal.     Right eye: Right conjunctiva is not injected. No exudate.    Left eye: Left conjunctiva is not injected. No exudate.    Pupils: Pupils are equal, round, and reactive to light.  Neck:     Thyroid: No thyroid mass, thyromegaly or thyroid tenderness.     Vascular: No carotid bruit.     Trachea: Trachea normal.  Cardiovascular:     Rate and Rhythm: Normal rate and  regular rhythm.     Pulses: Normal pulses.          Carotid pulses are 2+ on the right side and 2+ on the left side.      Radial pulses are 2+ on the right side and 2+ on the left side.       Dorsalis pedis pulses are 2+ on the right side and 2+ on the  left side.       Posterior tibial pulses are 2+ on the right side and 2+ on the left side.     Heart sounds: Normal heart sounds, S1 normal and S2 normal. No murmur heard.   No friction rub. No gallop.  Pulmonary:     Effort: Pulmonary effort is normal. No respiratory distress.     Breath sounds: Normal breath sounds and air entry. No stridor. No wheezing, rhonchi or rales.  Chest:     Chest wall: No tenderness.     Comments: Breast exam deferred; discussed 'know your lemons' campaign and self exam Abdominal:     General: Abdomen is flat. Bowel sounds are normal. There is no distension.     Palpations: Abdomen is soft. There is no mass.     Tenderness: There is no abdominal tenderness. There is no right CVA tenderness, left CVA tenderness, guarding or rebound.     Hernia: No hernia is present.  Genitourinary:    Comments: Exam deferred; denies complaints Musculoskeletal:        General: No swelling, tenderness, deformity or signs of injury. Normal range of motion.     Cervical back: Full passive range of motion without pain, normal range of motion and neck supple. No edema, rigidity or tenderness. No muscular tenderness.     Right lower leg: No edema.     Left lower leg: No edema.  Lymphadenopathy:     Cervical: No cervical adenopathy.     Right cervical: No superficial, deep or posterior cervical adenopathy.    Left cervical: No superficial, deep or posterior cervical adenopathy.  Skin:    General: Skin is warm and dry.     Capillary Refill: Capillary refill takes less than 2 seconds.     Coloration: Skin is not jaundiced or pale.     Findings: No bruising, erythema, lesion or rash.  Neurological:     General: No focal deficit  present.     Mental Status: She is alert and oriented to person, place, and time. Mental status is at baseline.     GCS: GCS eye subscore is 4. GCS verbal subscore is 5. GCS motor subscore is 6.     Sensory: Sensation is intact. No sensory deficit.     Motor: Motor function is intact. No weakness.     Coordination: Coordination is intact. Coordination normal.     Gait: Gait is intact. Gait normal.  Psychiatric:        Attention and Perception: Attention and perception normal.        Mood and Affect: Mood and affect normal.        Speech: Speech normal.        Behavior: Behavior normal. Behavior is cooperative.        Thought Content: Thought content normal.        Cognition and Memory: Cognition and memory normal.        Judgment: Judgment normal.     Last depression screening scores PHQ 2/9 Scores 06/27/2021 05/24/2020 05/05/2019  PHQ - 2 Score 2 2 0  PHQ- 9 Score 5 5 3    Last fall risk screening Fall Risk  05/24/2020  Falls in the past year? 0  Number falls in past yr: 0  Injury with Fall? 0   Last Audit-C alcohol use screening Alcohol Use Disorder Test (AUDIT) 05/24/2020  1. How often do you have a drink containing alcohol? 0  2. How many drinks containing alcohol do you  have on a typical day when you are drinking? 0  3. How often do you have six or more drinks on one occasion? 0  AUDIT-C Score 0   A score of 3 or more in women, and 4 or more in men indicates increased risk for alcohol abuse, EXCEPT if all of the points are from question 1   No results found for any visits on 06/27/21.  Assessment & Plan    Routine Health Maintenance and Physical Exam  Exercise Activities and Dietary recommendations  Goals   None     Immunization History  Administered Date(s) Administered   Tdap 05/05/2019    Health Maintenance  Topic Date Due   COVID-19 Vaccine (1) Never done   Zoster Vaccines- Shingrix (1 of 2) Never done   INFLUENZA VACCINE  Never done   COLONOSCOPY (Pts  45-64yrs Insurance coverage will need to be confirmed)  06/01/2022   MAMMOGRAM  08/31/2022   PAP SMEAR-Modifier  05/04/2024   TETANUS/TDAP  05/04/2029   Hepatitis C Screening  Completed   HIV Screening  Completed   HPV VACCINES  Aged Out    Discussed health benefits of physical activity, and encouraged her to engage in regular exercise appropriate for her age and condition.  Problem List Items Addressed This Visit       Cardiovascular and Mediastinum   Varicose veins of bilateral lower extremities with pain    Referral placed; no exam completed today      Relevant Medications   hydrochlorothiazide (HYDRODIURIL) 25 MG tablet   Other Relevant Orders   Ambulatory referral to Vascular Surgery   Primary hypertension    New dx; pt endorses family hx Start on hctz per her preference      Relevant Medications   hydrochlorothiazide (HYDRODIURIL) 25 MG tablet     Other   Reactive depression    Recent separation from boyfriend, has filed for legal paperwork for restraining order Denies SI/HI Does not wish to start medication Has been praying about change      Relevant Orders   T4, free   TSH   Annual physical exam - Primary    Things to do to keep yourself healthy  - Exercise at least 30-45 minutes a day, 3-4 days a week.  - Eat a low-fat diet with lots of fruits and vegetables, up to 7-9 servings per day.  - Seatbelts can save your life. Wear them always.  - Smoke detectors on every level of your home, check batteries every year.  - Eye Doctor - have an eye exam every 1-2 years  - Safe sex - if you may be exposed to STDs, use a condom.  - Alcohol -  If you drink, do it moderately, less than 2 drinks per day.  - [place]. Choose someone to speak for you if you are not able.  - Depression is common in our stressful world.If you're feeling down or losing interest in things you normally enjoy, please come in for a visit.  - Violence - If anyone is  threatening or hurting you, please call immediately.        Screening for diabetes mellitus    Screening a1c today given age and BMI      Relevant Orders   Hemoglobin A1c   Encounter for lipid screening for cardiovascular disease    recommend diet low in saturated fat and regular exercise - 30 min at least 5 times per week Screening lipid  panel today      Relevant Orders   Lipid panel   Acute pain of right knee    Occasionally swelling; doing well with home exercises      Relevant Orders   Magnesium   Chronic fatigue    Recommend thyroid level checks today      Relevant Orders   CBC with Differential/Platelet   Comprehensive metabolic panel     Return in about 6 weeks (around 08/08/2021) for blood pressure check, HTN management, rash.     Vonna Kotyk, FNP, have reviewed all documentation for this visit. The documentation on 06/27/21 for the exam, diagnosis, procedures, and orders are all accurate and complete.    Gwyneth Sprout, [place] [phone] (phone) [phone] (fax)  [place]

## 2021-06-27 NOTE — Assessment & Plan Note (Addendum)
Recent separation from boyfriend, has filed for legal paperwork for restraining order Denies SI/HI Does not wish to start medication Has been praying about change

## 2021-06-27 NOTE — Assessment & Plan Note (Signed)
Recommend thyroid level checks today

## 2021-06-28 ENCOUNTER — Encounter: Payer: Self-pay | Admitting: Family Medicine

## 2021-06-28 LAB — COMPREHENSIVE METABOLIC PANEL
ALT: 27 IU/L (ref 0–32)
AST: 18 IU/L (ref 0–40)
Albumin/Globulin Ratio: 1.8 (ref 1.2–2.2)
Albumin: 4.4 g/dL (ref 3.8–4.9)
Alkaline Phosphatase: 156 IU/L — ABNORMAL HIGH (ref 44–121)
BUN/Creatinine Ratio: 13 (ref 9–23)
BUN: 11 mg/dL (ref 6–24)
Bilirubin Total: 0.3 mg/dL (ref 0.0–1.2)
CO2: 22 mmol/L (ref 20–29)
Calcium: 10 mg/dL (ref 8.7–10.2)
Chloride: 105 mmol/L (ref 96–106)
Creatinine, Ser: 0.82 mg/dL (ref 0.57–1.00)
Globulin, Total: 2.5 g/dL (ref 1.5–4.5)
Glucose: 80 mg/dL (ref 70–99)
Potassium: 4 mmol/L (ref 3.5–5.2)
Sodium: 142 mmol/L (ref 134–144)
Total Protein: 6.9 g/dL (ref 6.0–8.5)
eGFR: 85 mL/min/{1.73_m2} (ref >59)

## 2021-06-28 LAB — LIPID PANEL
Chol/HDL Ratio: 4.6 ratio — ABNORMAL HIGH (ref 0.0–4.4)
Cholesterol, Total: 173 mg/dL (ref 100–199)
HDL: 38 mg/dL — ABNORMAL LOW (ref >39)
LDL Chol Calc (NIH): 125 mg/dL — ABNORMAL HIGH (ref 0–99)
Triglycerides: 48 mg/dL (ref 0–149)
VLDL Cholesterol Cal: 10 mg/dL (ref 5–40)

## 2021-06-28 LAB — CBC WITH DIFFERENTIAL/PLATELET
Basophils Absolute: 0 10*3/uL (ref 0.0–0.2)
Basos: 0 %
EOS (ABSOLUTE): 0.1 10*3/uL (ref 0.0–0.4)
Eos: 1 %
Hematocrit: 42.3 % (ref 34.0–46.6)
Hemoglobin: 13.8 g/dL (ref 11.1–15.9)
Immature Grans (Abs): 0 10*3/uL (ref 0.0–0.1)
Immature Granulocytes: 0 %
Lymphocytes Absolute: 2.9 10*3/uL (ref 0.7–3.1)
Lymphs: 40 %
MCH: 27.2 pg (ref 26.6–33.0)
MCHC: 32.6 g/dL (ref 31.5–35.7)
MCV: 83 fL (ref 79–97)
Monocytes Absolute: 0.7 10*3/uL (ref 0.1–0.9)
Monocytes: 9 %
Neutrophils Absolute: 3.7 10*3/uL (ref 1.4–7.0)
Neutrophils: 50 %
Platelets: 301 10*3/uL (ref 150–450)
RBC: 5.08 x10E6/uL (ref 3.77–5.28)
RDW: 12.6 % (ref 11.7–15.4)
WBC: 7.4 10*3/uL (ref 3.4–10.8)

## 2021-06-28 LAB — T4, FREE: Free T4: 1 ng/dL (ref 0.82–1.77)

## 2021-06-28 LAB — MAGNESIUM: Magnesium: 2.1 mg/dL (ref 1.6–2.3)

## 2021-06-28 LAB — TSH: TSH: 6.57 u[IU]/mL — ABNORMAL HIGH (ref 0.450–4.500)

## 2021-06-28 LAB — HEMOGLOBIN A1C
Est. average glucose Bld gHb Est-mCnc: 111 mg/dL
Hgb A1c MFr Bld: 5.5 % (ref 4.8–5.6)

## 2021-07-17 ENCOUNTER — Other Ambulatory Visit (INDEPENDENT_AMBULATORY_CARE_PROVIDER_SITE_OTHER): Payer: Self-pay | Admitting: Nurse Practitioner

## 2021-07-17 DIAGNOSIS — I83813 Varicose veins of bilateral lower extremities with pain: Secondary | ICD-10-CM

## 2021-07-19 ENCOUNTER — Ambulatory Visit (INDEPENDENT_AMBULATORY_CARE_PROVIDER_SITE_OTHER): Payer: Managed Care, Other (non HMO) | Admitting: Nurse Practitioner

## 2021-07-19 ENCOUNTER — Encounter (INDEPENDENT_AMBULATORY_CARE_PROVIDER_SITE_OTHER): Payer: Self-pay | Admitting: Nurse Practitioner

## 2021-07-19 ENCOUNTER — Other Ambulatory Visit: Payer: Self-pay

## 2021-07-19 ENCOUNTER — Ambulatory Visit (INDEPENDENT_AMBULATORY_CARE_PROVIDER_SITE_OTHER): Payer: Managed Care, Other (non HMO)

## 2021-07-19 VITALS — BP 113/73 | HR 56 | Ht 64.0 in | Wt 165.0 lb

## 2021-07-19 DIAGNOSIS — I10 Essential (primary) hypertension: Secondary | ICD-10-CM

## 2021-07-19 DIAGNOSIS — I83813 Varicose veins of bilateral lower extremities with pain: Secondary | ICD-10-CM | POA: Diagnosis not present

## 2021-07-30 ENCOUNTER — Encounter (INDEPENDENT_AMBULATORY_CARE_PROVIDER_SITE_OTHER): Payer: Self-pay | Admitting: Nurse Practitioner

## 2021-07-30 NOTE — Progress Notes (Signed)
Subjective:    Patient ID: Heather Curry, female    DOB: [birth year], 55 y.o.   MRN: [id number] No chief complaint on file.   The patient is seen for evaluation of varicose veins.  She notes that these tend to be worse after she stands for long periods.  She does endorse having some discomfort and swelling.  There have been no episodes of bleeding from the varicosities.  There is no history of DVT, PE or superficial thrombophlebitis. There is no history of ulceration or hemorrhage. The patient denies a significant family history of varicose veins.  The patient has worn graduated compression in the past. At the present time the patient has not been using over-the-counter analgesics. There is no history of prior surgical intervention or sclerotherapy.  Today noninvasive study showed no evidence of DVT or superficial thrombophlebitis bilaterally.  No evidence of deep venous insufficiency or superficial venous reflux bilaterally.   Review of Systems  All other systems reviewed and are negative.     Objective:   Physical Exam Vitals reviewed.  HENT:     Head: Normocephalic.  Cardiovascular:     Rate and Rhythm: Normal rate.     Pulses: Normal pulses.  Pulmonary:     Effort: Pulmonary effort is normal.  Skin:    General: Skin is warm and dry.     Capillary Refill: Capillary refill takes less than 2 seconds.  Neurological:     Mental Status: She is alert and oriented to person, place, and time.  Psychiatric:        Mood and Affect: Mood normal.        Behavior: Behavior normal.        Thought Content: Thought content normal.        Judgment: Judgment normal.    BP 113/73   Pulse (!) 56   Ht 5\' 4"  (1.626 m)   Wt 165 lb (74.8 kg)   BMI 28.32 kg/m   Past Medical History:  Diagnosis Date   Colon polyps     Social History   Socioeconomic History   Marital status: Single    Spouse name: Not on file   Number of children: Not on file   Years of education: Not on file    Highest education level: Not on file  Occupational History   Not on file  Tobacco Use   Smoking status: Former    Types: Cigarettes    Quit date: 2009    Years since quitting: 13.8   Smokeless tobacco: Never  Vaping Use   Vaping Use: Never used  Substance and Sexual Activity   Alcohol use: Yes    Comment: rarely   Drug use: Never   Sexual activity: Not on file  Other Topics Concern   Not on file  Social History Narrative   Not on file   Social Determinants of Health   Financial Resource Strain: Not on file  Food Insecurity: Not on file  Transportation Needs: Not on file  Physical Activity: Not on file  Stress: Not on file  Social Connections: Not on file  Intimate Partner Violence: Not on file    Past Surgical History:  Procedure Laterality Date   APPENDECTOMY     BREAST BIOPSY Right 2007   neg   BREAST BIOPSY Right 09/27/2020   affirm breast bx, x marker, path pending   BREAST SURGERY Left    Lumpectomy   CERVIX REMOVAL     COLONOSCOPY WITH PROPOFOL N/A  04/18/2018   Procedure: COLONOSCOPY WITH PROPOFOL;  Surgeon: Virgel Manifold, MD;  Location: [facility] ENDOSCOPY;  Service: Gastroenterology;  Laterality: N/A;   COLONOSCOPY WITH PROPOFOL N/A 04/17/2019   Procedure: COLONOSCOPY WITH PROPOFOL;  Surgeon: Virgel Manifold, MD;  Location: [facility] ENDOSCOPY;  Service: Endoscopy;  Laterality: N/A;   COLONOSCOPY WITH PROPOFOL N/A 06/01/2020   Procedure: COLONOSCOPY WITH PROPOFOL;  Surgeon: Virgel Manifold, MD;  Location: [facility] ENDOSCOPY;  Service: Endoscopy;  Laterality: N/A;    Family History  Problem Relation Age of Onset   Congestive Heart Failure Mother    Hypertension Mother    Arthritis Mother    Alcohol abuse Father    Hypertension Father    COPD Father    Arthritis Maternal Grandmother    Cancer Maternal Grandmother    Hypertension Sister    Breast cancer Maternal Aunt 42    Allergies  Allergen Reactions   Bee Venom Anaphylaxis   Penicillins      CBC Latest Ref Rng & Units 06/27/2021 05/24/2020 05/06/2019  WBC 3.4 - 10.8 x10E3/uL 7.4 6.0 8.0  Hemoglobin 11.1 - 15.9 g/dL 13.8 13.3 13.8  Hematocrit 34.0 - 46.6 % 42.3 41.1 41.9  Platelets 150 - 450 x10E3/uL 301 265 255      CMP     Component Value Date/Time   NA 142 06/27/2021 1632   K 4.0 06/27/2021 1632   CL 105 06/27/2021 1632   CO2 22 06/27/2021 1632   GLUCOSE 80 06/27/2021 1632   BUN 11 06/27/2021 1632   CREATININE 0.82 06/27/2021 1632   CALCIUM 10.0 06/27/2021 1632   PROT 6.9 06/27/2021 1632   ALBUMIN 4.4 06/27/2021 1632   AST 18 06/27/2021 1632   ALT 27 06/27/2021 1632   ALKPHOS 156 (H) 06/27/2021 1632   BILITOT 0.3 06/27/2021 1632   GFRNONAA 87 05/24/2020 1139   GFRAA 100 05/24/2020 1139     No results found.     Assessment & Plan:   1. Varicose veins of bilateral lower extremities with pain Discussed the natural pathophysiology of varicose veins as well as venous reflux.  Unfortunately the patient does not have any evidence of venous reflux noted today.  Based on this her varicosities would not be eligible for treatment at this time unless she were to have been treated cosmetically.  Patient is advised that if she notices more spider veins or larger bulging veins we would be happy to reevaluate.  Otherwise patient will follow up on an as-needed basis.  2. Primary hypertension Continue antihypertensive medications as already ordered, these medications have been reviewed and there are no changes at this time.    Current Outpatient Medications on File Prior to Visit  Medication Sig Dispense Refill   hydrochlorothiazide (HYDRODIURIL) 25 MG tablet Take 1 tablet (25 mg total) by mouth daily. 90 tablet 3   No current facility-administered medications on file prior to visit.    There are no Patient Instructions on file for this visit. No follow-ups on file.   Kris Hartmann, NP

## 2022-04-15 ENCOUNTER — Ambulatory Visit
Admission: EM | Admit: 2022-04-15 | Discharge: 2022-04-15 | Disposition: A | Discharge status: Home / Self Care | Payer: Managed Care, Other (non HMO) | Attending: Emergency Medicine | Admitting: Emergency Medicine

## 2022-04-15 ENCOUNTER — Encounter: Payer: Self-pay | Admitting: Emergency Medicine

## 2022-04-15 DIAGNOSIS — B029 Zoster without complications: Secondary | ICD-10-CM

## 2022-04-15 MED ORDER — TRAMADOL HCL 50 MG PO TABS: 50.0000 mg | ORAL_TABLET | Freq: Four times a day (QID) | ORAL | 0 refills | Status: DC | PRN

## 2022-04-15 MED ORDER — VALACYCLOVIR HCL 1 G PO TABS: 1000.0000 mg | ORAL_TABLET | Freq: Three times a day (TID) | ORAL | 0 refills | Status: DC

## 2022-04-15 NOTE — ED Triage Notes (Signed)
Pt presents with a painful rash on her right arm x 3 days

## 2022-04-15 NOTE — ED Provider Notes (Signed)
Roderic Palau    CSN: [id number] Arrival date & time: 04/15/22  0907      History   Chief Complaint Chief Complaint  Patient presents with   Rash    HPI Heather Curry is a 56 y.o. female.  Patient presents with 3 day history of painful rash on right arm.  The rash is painful to touch.  Patient reports the pain keeps her up awake at night.  No new products, medications, foods.  No fever, sore throat, cough, shortness of breath, or other symptoms.  Treatment attempted at home with Tylenol, ibuprofen, topical ointment.  Her medical history includes hypertension, diverticulosis, chronic fatigue.    The history is provided by the patient and medical records.    Past Medical History:  Diagnosis Date   Colon polyps     Patient Active Problem List   Diagnosis Date Noted   Varicose veins of bilateral lower extremities with pain 06/27/2021   Reactive depression 06/27/2021   Annual physical exam 06/27/2021   White coat syndrome with high blood pressure without hypertension 06/27/2021   Screening for diabetes mellitus 06/27/2021   Encounter for lipid screening for cardiovascular disease 06/27/2021   Acute pain of right knee 06/27/2021   Primary hypertension 06/27/2021   Chronic fatigue 06/27/2021   History of colonic polyps    Polyp of colon    Positive colorectal cancer screening using Cologuard test    Benign neoplasm of ascending colon    Diverticulosis of large intestine without diverticulitis    Benign neoplasm of cecum     Past Surgical History:  Procedure Laterality Date   APPENDECTOMY     BREAST BIOPSY Right 2007   neg   BREAST BIOPSY Right 09/27/2020   affirm breast bx, x marker, path pending   BREAST SURGERY Left    Lumpectomy   CERVIX REMOVAL     COLONOSCOPY WITH PROPOFOL N/A 04/18/2018   Procedure: COLONOSCOPY WITH PROPOFOL;  Surgeon: Virgel Manifold, MD;  Location: [facility] ENDOSCOPY;  Service: Gastroenterology;  Laterality: N/A;   COLONOSCOPY WITH  PROPOFOL N/A 04/17/2019   Procedure: COLONOSCOPY WITH PROPOFOL;  Surgeon: Virgel Manifold, MD;  Location: [facility] ENDOSCOPY;  Service: Endoscopy;  Laterality: N/A;   COLONOSCOPY WITH PROPOFOL N/A 06/01/2020   Procedure: COLONOSCOPY WITH PROPOFOL;  Surgeon: Virgel Manifold, MD;  Location: [facility] ENDOSCOPY;  Service: Endoscopy;  Laterality: N/A;    OB History   No obstetric history on file.      Home Medications    Prior to Admission medications   Medication Sig Start Date End Date Taking? Authorizing Provider  traMADol (ULTRAM) 50 MG tablet Take 1 tablet (50 mg total) by mouth every 6 (six) hours as needed. 04/15/22  Yes Sharion Balloon, NP  valACYclovir (VALTREX) 1000 MG tablet Take 1 tablet (1,000 mg total) by mouth 3 (three) times daily. 04/15/22  Yes Sharion Balloon, NP  hydrochlorothiazide (HYDRODIURIL) 25 MG tablet Take 1 tablet (25 mg total) by mouth daily. 06/27/21   Gwyneth Sprout, FNP    Family History Family History  Problem Relation Age of Onset   Congestive Heart Failure Mother    Hypertension Mother    Arthritis Mother    Alcohol abuse Father    Hypertension Father    COPD Father    Arthritis Maternal Grandmother    Cancer Maternal Grandmother    Hypertension Sister    Breast cancer Maternal Aunt 80    Social History Social History  Tobacco Use   Smoking status: Former    Types: Cigarettes    Quit date: 2009    Years since quitting: 14.5   Smokeless tobacco: Never  Vaping Use   Vaping Use: Never used  Substance Use Topics   Alcohol use: Yes    Comment: rarely   Drug use: Never     Allergies   Bee venom and Penicillins   Review of Systems Review of Systems  Constitutional:  Negative for chills and fever.  HENT:  Negative for ear pain and sore throat.   Respiratory:  Negative for cough and shortness of breath.   Musculoskeletal:  Negative for arthralgias and joint swelling.  Skin:  Positive for color change and rash.  Neurological:  Negative  for weakness and numbness.  All other systems reviewed and are negative.    Physical Exam Triage Vital Signs ED Triage Vitals  Enc Vitals Group     BP      Pulse      Resp      Temp      Temp src      SpO2      Weight      Height      Head Circumference      Peak Flow      Pain Score      Pain Loc      Pain Edu?      Excl. in [place]?    No data found.  Updated Vital Signs BP 110/75 (BP Location: Left Arm)   Pulse 70   Temp 98.1 F (36.7 C) (Oral)   Resp 18   SpO2 97%   Visual Acuity Right Eye Distance:   Left Eye Distance:   Bilateral Distance:    Right Eye Near:   Left Eye Near:    Bilateral Near:     Physical Exam Vitals and nursing note reviewed.  Constitutional:      General: She is not in acute distress.    Appearance: She is well-developed. She is not ill-appearing.  HENT:     Mouth/Throat:     Mouth: Mucous membranes are moist.  Cardiovascular:     Rate and Rhythm: Normal rate and regular rhythm.     Heart sounds: Normal heart sounds.  Pulmonary:     Effort: Pulmonary effort is normal. No respiratory distress.     Breath sounds: Normal breath sounds.  Musculoskeletal:        General: No swelling. Normal range of motion.     Cervical back: Neck supple.  Skin:    General: Skin is warm and dry.     Capillary Refill: Capillary refill takes less than 2 seconds.     Findings: Rash present.     Comments: Tender clustered erythematous vesicular and papular rash on right arm.  See pictures.    Neurological:     Mental Status: She is alert.     Sensory: No sensory deficit.     Motor: No weakness.  Psychiatric:        Mood and Affect: Mood normal.        Behavior: Behavior normal.         UC Treatments / Results  Labs (all labs ordered are listed, but only abnormal results are displayed) Labs Reviewed - No data to display  EKG   Radiology No results found.  Procedures Procedures (including critical care time)  Medications Ordered in  UC Medications - No data to display  Initial  Impression / Assessment and Plan / UC Course  I have reviewed the triage vital signs and the nursing notes.  Pertinent labs & imaging results that were available during my care of the patient were reviewed by me and considered in my medical decision making (see chart for details).    Herpes zoster.  Treating with valacyclovir.  Discussed Tylenol or ibuprofen as needed for discomfort.  Also prescribed tramadol as needed for pain; precautions for drowsiness with this medication discussed.  Education provided on shingles.  Instructed patient to follow-up with PCP in 2 to 3 days.  She agrees to plan of care.  Final Clinical Impressions(s) / UC Diagnoses   Final diagnoses:  Herpes zoster without complication     Discharge Instructions      Take the valacyclovir as directed.  Take the prescribed tramadol as needed for pain; do not drive, operate machinery, or drink alcohol with this medication as it may cause drowsiness.  Follow-up with your primary care provider.     ED Prescriptions     Medication Sig Dispense Auth. Provider   valACYclovir (VALTREX) 1000 MG tablet Take 1 tablet (1,000 mg total) by mouth 3 (three) times daily. 21 tablet Sharion Balloon, NP   traMADol (ULTRAM) 50 MG tablet Take 1 tablet (50 mg total) by mouth every 6 (six) hours as needed. 15 tablet Sharion Balloon, NP      I have reviewed the PDMP during this encounter.   Sharion Balloon, NP 04/15/22 [phone]

## 2022-04-15 NOTE — Discharge Instructions (Addendum)
Take the valacyclovir as directed.  Take the prescribed tramadol as needed for pain; do not drive, operate machinery, or drink alcohol with this medication as it may cause drowsiness.  Follow-up with your primary care provider.

## 2022-04-17 ENCOUNTER — Ambulatory Visit: Payer: Self-pay

## 2022-04-17 NOTE — Telephone Encounter (Signed)
  Chief Complaint: shingles pain Symptoms: nerve, burning pain to R arm from shoulder down to hand Frequency: 2-3 days Pertinent Negatives: NA Disposition: '[]'$ ED /'[]'$ Urgent Care (no appt availability in office) / '[x]'$ Appointment(In office/virtual)/ '[]'$  [place] [facility]/ '[]'$ Home Care/ '[]'$ Refused Recommended Disposition /'[]'$ [place] [facility]/ '[]'$  Follow-up with PCP Additional Notes: pt states she has tried everything including the Tramadol for pain and nothing is helping. 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 Scheduled pt for VV tomorrow at 1300 wit Dr. Ky Barban. Pt was also asking about scheduling mammogram and colonoscopy. I advised pt I would send message back and have someone fu with her to get those scheduled.   Summary: shingles   Pt was diagnosed with shingles on her right arm and se is asking for some other medication due to the pain / it is effecting her work / please advise if pt can another med or needs a f/u appt      Reason for Disposition  SEVERE pain (e.g., excruciating)  Answer Assessment - Initial Assessment Questions 1. APPEARANCE of RASH: "Describe the rash."      Shingles burning pain  2. LOCATION: "Where is the rash located?"      R arm outside top, inside elbow  3. ONSET: "When did the rash start?"      04/15/22 5. PAIN: "Does the rash hurt?" If Yes, ask: "How bad is the pain?"  (Scale 0-10; or none, mild, moderate, severe)    - NONE (0): no pain    - MILD (1-3): doesn't interfere with normal activities     - MODERATE (4-7): interferes with normal activities or awakens from sleep     - SEVERE (8-10): excruciating pain, unable to do any normal activities     Moderate  Protocols used: Shingles (Zoster)-A-AH

## 2022-04-17 NOTE — Telephone Encounter (Signed)
Please advise referrals?

## 2022-04-17 NOTE — Progress Notes (Unsigned)
    MyChart Video Visit    Virtual Visit via Video Note   This visit type was conducted due to national recommendations for restrictions regarding the COVID-19 Pandemic (e.g. social distancing) in an effort to limit this patient's exposure and mitigate transmission in our community. This patient is at least at moderate risk for complications without adequate follow up. This format is felt to be most appropriate for this patient at this time. Physical exam was limited by quality of the video and audio technology used for the visit.   Patient location: home Provider location: [facility]  I discussed the limitations of evaluation and management by telemedicine and the availability of in person appointments. The patient expressed understanding and agreed to proceed.  Patient: Heather Curry   DOB: 10/07/1965   56 y.o. Female  MRN: [id number] Visit Date: 04/18/2022  Today's healthcare provider: Myles Gip, DO   No chief complaint on file.  Subjective    HPI   Shingles - seen in UC 7/30 for painful rash on lateral R arm and in cubital fossa, dxed with shingles. Rx valtrex and tramadol for pain - still having intense pain, numbness up R arm into neck and face.  - having trouble sleeping - works from home.  - rash is improved   Medications: Outpatient Medications Prior to Visit  Medication Sig   hydrochlorothiazide (HYDRODIURIL) 25 MG tablet Take 1 tablet (25 mg total) by mouth daily.   traMADol (ULTRAM) 50 MG tablet Take 1 tablet (50 mg total) by mouth every 6 (six) hours as needed.   valACYclovir (VALTREX) 1000 MG tablet Take 1 tablet (1,000 mg total) by mouth 3 (three) times daily.   Sodium Sulfate-Mag Sulfate-KCl (SUTAB) [phone] MG TABS Take 1 Dose by mouth once for 1 dose.   No facility-administered medications prior to visit.    Review of Systems     Objective    There were no vitals taken for this visit.     Physical Exam  Well appearing, in NAD. Speaks in  full sentences. Comfortable WOB on RA. No resp distress.  Difficult to assess rash due to virtual setting and video quality.    Assessment & Plan     Shingles With herpetic neuralgia, will provide steroid burst. Continue antiviral. Recommend shingles vaccine once healed. F/u prn.   Return if symptoms worsen or fail to improve.     I discussed the assessment and treatment plan with the patient. The patient was provided an opportunity to ask questions and all were answered. The patient agreed with the plan and demonstrated an understanding of the instructions.   The patient was advised to call back or seek an in-person evaluation if the symptoms worsen or if the condition fails to improve as anticipated.  I provided 12 minutes of non-face-to-face time during this encounter.  Myles Gip, [place] [phone] (phone) [phone] (fax)  [place]

## 2022-04-18 ENCOUNTER — Telehealth: Payer: Self-pay

## 2022-04-18 ENCOUNTER — Encounter: Payer: Self-pay | Admitting: Family Medicine

## 2022-04-18 ENCOUNTER — Other Ambulatory Visit: Payer: Self-pay

## 2022-04-18 ENCOUNTER — Telehealth (INDEPENDENT_AMBULATORY_CARE_PROVIDER_SITE_OTHER): Payer: Managed Care, Other (non HMO) | Admitting: Family Medicine

## 2022-04-18 DIAGNOSIS — B029 Zoster without complications: Secondary | ICD-10-CM

## 2022-04-18 DIAGNOSIS — Z86010 Personal history of colonic polyps: Secondary | ICD-10-CM

## 2022-04-18 MED ORDER — PREDNISONE 20 MG PO TABS: 40.0000 mg | ORAL_TABLET | Freq: Every day | ORAL | 0 refills | Status: AC

## 2022-04-18 MED ORDER — SUTAB 1479-225-188 MG PO TABS: 1.0000 | ORAL_TABLET | Freq: Once | ORAL | 0 refills | Status: AC

## 2022-04-18 NOTE — Telephone Encounter (Signed)
Gastroenterology Pre-Procedure Review  Request Date: 06/01/22 Requesting Physician: Dr. Marius Ditch  PATIENT REVIEW QUESTIONS: The patient responded to the following health history questions as indicated:    1. Are you having any GI issues? no 2. Do you have a personal history of Polyps? yes (06/01/20 performed by Dr. Bonna Gains) 3. Do you have a family history of Colon Cancer or Polyps? yes (paternal aunt colon cancer) 4. Diabetes Mellitus? no 5. Joint replacements in the past 12 months?no 6. Major health problems in the past 3 months?no 7. Any artificial heart valves, MVP, or defibrillator?no    MEDICATIONS & ALLERGIES:    Patient reports the following regarding taking any anticoagulation/antiplatelet therapy:   Plavix, Coumadin, Eliquis, Xarelto, Lovenox, Pradaxa, Brilinta, or Effient? no Aspirin? no  Patient confirms/reports the following medications:  Current Outpatient Medications  Medication Sig Dispense Refill   hydrochlorothiazide (HYDRODIURIL) 25 MG tablet Take 1 tablet (25 mg total) by mouth daily. 90 tablet 3   traMADol (ULTRAM) 50 MG tablet Take 1 tablet (50 mg total) by mouth every 6 (six) hours as needed. 15 tablet 0   valACYclovir (VALTREX) 1000 MG tablet Take 1 tablet (1,000 mg total) by mouth 3 (three) times daily. 21 tablet 0   No current facility-administered medications for this visit.    Patient confirms/reports the following allergies:  Allergies  Allergen Reactions   Bee Venom Anaphylaxis   Penicillins     No orders of the defined types were placed in this encounter.   AUTHORIZATION INFORMATION Primary Insurance: 1D#: Group #:  Secondary Insurance: 1D#: Group #:  SCHEDULE INFORMATION: Date: 06/01/22 Time: Location: [facility]

## 2022-06-01 ENCOUNTER — Encounter: Payer: Self-pay | Admitting: Gastroenterology

## 2022-06-01 ENCOUNTER — Ambulatory Visit: Payer: Managed Care, Other (non HMO) | Admitting: Anesthesiology

## 2022-06-01 ENCOUNTER — Ambulatory Visit
Admission: RE | Admit: 2022-06-01 | Discharge: 2022-06-01 | Disposition: A | Discharge status: Home / Self Care | Payer: Managed Care, Other (non HMO) | Attending: Gastroenterology | Admitting: Gastroenterology

## 2022-06-01 ENCOUNTER — Encounter
Admission: RE | Disposition: A | Discharge status: Home / Self Care | Payer: Self-pay | Source: Home / Self Care | Attending: Gastroenterology

## 2022-06-01 DIAGNOSIS — Z86010 Personal history of colonic polyps: Secondary | ICD-10-CM | POA: Diagnosis not present

## 2022-06-01 DIAGNOSIS — D122 Benign neoplasm of ascending colon: Secondary | ICD-10-CM | POA: Insufficient documentation

## 2022-06-01 DIAGNOSIS — Z1211 Encounter for screening for malignant neoplasm of colon: Secondary | ICD-10-CM | POA: Insufficient documentation

## 2022-06-01 DIAGNOSIS — I10 Essential (primary) hypertension: Secondary | ICD-10-CM | POA: Diagnosis not present

## 2022-06-01 DIAGNOSIS — Z87891 Personal history of nicotine dependence: Secondary | ICD-10-CM | POA: Insufficient documentation

## 2022-06-01 HISTORY — PX: COLONOSCOPY WITH PROPOFOL: SHX5780

## 2022-06-01 HISTORY — DX: Essential (primary) hypertension: I10

## 2022-06-01 SURGERY — COLONOSCOPY WITH PROPOFOL
Anesthesia: General

## 2022-06-01 MED ORDER — PROPOFOL 10 MG/ML IV BOLUS
INTRAVENOUS | Status: DC | PRN
  Administered 2022-06-01: 80 mg via INTRAVENOUS

## 2022-06-01 MED ORDER — GLYCOPYRROLATE 0.2 MG/ML IJ SOLN
INTRAMUSCULAR | Status: DC | PRN
  Administered 2022-06-01: .2 mg via INTRAVENOUS

## 2022-06-01 MED ORDER — PROPOFOL 500 MG/50ML IV EMUL
INTRAVENOUS | Status: DC | PRN
  Administered 2022-06-01: 140 ug/kg/min via INTRAVENOUS

## 2022-06-01 MED ORDER — SODIUM CHLORIDE 0.9 % IV SOLN: INTRAVENOUS | Status: DC

## 2022-06-01 NOTE — Anesthesia Postprocedure Evaluation (Signed)
Anesthesia Post Note  Patient: Heather Curry  Procedure(s) Performed: COLONOSCOPY WITH PROPOFOL  Patient location during evaluation: Endoscopy Anesthesia Type: General Level of consciousness: awake and alert Pain management: pain level controlled Vital Signs Assessment: post-procedure vital signs reviewed and stable Respiratory status: spontaneous breathing, nonlabored ventilation, respiratory function stable and patient connected to nasal cannula oxygen Cardiovascular status: blood pressure returned to baseline and stable Postop Assessment: no apparent nausea or vomiting Anesthetic complications: no   No notable events documented.   Last Vitals:  Vitals:   06/01/22 1206 06/01/22 1216  BP: 110/75 (!) 120/99  Pulse: (!) 57 (!) 58  Resp: 18 17  Temp:    SpO2: 98% 100%    Last Pain:  Vitals:   06/01/22 1216  TempSrc:   PainSc: 0-No pain                 Ilene Qua

## 2022-06-01 NOTE — H&P (Signed)
Heather Darby, MD [address]  [place], [place] [address]  Main: [phone]  Fax: [phone] Pager: [phone]  Primary Care Physician:  Gwyneth Sprout, FNP Primary Gastroenterologist:  Dr. Cephas Curry  Pre-Procedure History & Physical: HPI:  Heather Curry is a 56 y.o. female is here for an colonoscopy.   Past Medical History:  Diagnosis Date   Colon polyps    Hypertension     Past Surgical History:  Procedure Laterality Date   APPENDECTOMY     BREAST BIOPSY Right 2007   neg   BREAST BIOPSY Right 09/27/2020   affirm breast bx, x marker, path pending   BREAST SURGERY Left    Lumpectomy   CERVIX REMOVAL     COLONOSCOPY WITH PROPOFOL N/A 04/18/2018   Procedure: COLONOSCOPY WITH PROPOFOL;  Surgeon: Virgel Manifold, MD;  Location: [facility] ENDOSCOPY;  Service: Gastroenterology;  Laterality: N/A;   COLONOSCOPY WITH PROPOFOL N/A 04/17/2019   Procedure: COLONOSCOPY WITH PROPOFOL;  Surgeon: Virgel Manifold, MD;  Location: [facility] ENDOSCOPY;  Service: Endoscopy;  Laterality: N/A;   COLONOSCOPY WITH PROPOFOL N/A 06/01/2020   Procedure: COLONOSCOPY WITH PROPOFOL;  Surgeon: Virgel Manifold, MD;  Location: [facility] ENDOSCOPY;  Service: Endoscopy;  Laterality: N/A;    Prior to Admission medications   Medication Sig Start Date End Date Taking? Authorizing Provider  hydrochlorothiazide (HYDRODIURIL) 25 MG tablet Take 1 tablet (25 mg total) by mouth daily. 06/27/21   Gwyneth Sprout, FNP  traMADol (ULTRAM) 50 MG tablet Take 1 tablet (50 mg total) by mouth every 6 (six) hours as needed. 04/15/22   Sharion Balloon, NP  valACYclovir (VALTREX) 1000 MG tablet Take 1 tablet (1,000 mg total) by mouth 3 (three) times daily. 04/15/22   Sharion Balloon, NP    Allergies as of 04/18/2022 - Review Complete 04/18/2022  Allergen Reaction Noted   Bee venom Anaphylaxis 02/14/2018   Penicillins  04/17/2019    Family History  Problem Relation Age of Onset   Congestive Heart  Failure Mother    Hypertension Mother    Arthritis Mother    Alcohol abuse Father    Hypertension Father    COPD Father    Arthritis Maternal Grandmother    Cancer Maternal Grandmother    Hypertension Sister    Breast cancer Maternal Aunt 80    Social History   Socioeconomic History   Marital status: Single    Spouse name: Not on file   Number of children: Not on file   Years of education: Not on file   Highest education level: Not on file  Occupational History   Not on file  Tobacco Use   Smoking status: Former    Types: Cigarettes    Quit date: 2009    Years since quitting: 14.7   Smokeless tobacco: Never  Vaping Use   Vaping Use: Never used  Substance and Sexual Activity   Alcohol use: Yes    Comment: rarely   Drug use: Never   Sexual activity: Not on file  Other Topics Concern   Not on file  Social History Narrative   Not on file   Social Determinants of Health   Financial Resource Strain: Not on file  Food Insecurity: Not on file  Transportation Needs: Not on file  Physical Activity: Not on file  Stress: Not on file  Social Connections: Not on file  Intimate Partner Violence: Not on file    Review of Systems: See  HPI, otherwise negative ROS  Physical Exam: BP (!) 154/91   Pulse (!) 50   Temp (!) 97.1 F (36.2 C) (Temporal)   Resp 18   Ht '5\' 4"'$  (1.626 m)   Wt 155 lb (70.3 kg)   SpO2 100%   BMI 26.61 kg/m  General:   Alert,  pleasant and cooperative in NAD Head:  Normocephalic and atraumatic. Neck:  Supple; no masses or thyromegaly. Lungs:  Clear throughout to auscultation.    Heart:  Regular rate and rhythm. Abdomen:  Soft, nontender and nondistended. Normal bowel sounds, without guarding, and without rebound.   Neurologic:  Alert and  oriented x4;  grossly normal neurologically.  Impression/Plan: Heather Curry is here for an colonoscopy to be performed for h/o colon adenomas  Risks, benefits, limitations, and alternatives regarding   colonoscopy have been reviewed with the patient.  Questions have been answered.  All parties agreeable.   Sherri Sear, MD  06/01/2022, 10:45 AM

## 2022-06-01 NOTE — Transfer of Care (Signed)
Immediate Anesthesia Transfer of Care Note  Patient: Heather Curry  Procedure(s) Performed: COLONOSCOPY WITH PROPOFOL  Patient Location: PACU  Anesthesia Type:General  Level of Consciousness: awake, alert  and oriented  Airway & Oxygen Therapy: Patient Spontanous Breathing  Post-op Assessment: Report given to RN and Post -op Vital signs reviewed and stable  Post vital signs: Reviewed and stable  Last Vitals:  Vitals Value Taken Time  BP 87/53 06/01/22 1156  Temp 35.9 C 06/01/22 1156  Pulse 70 06/01/22 1156  Resp 13 06/01/22 1156  SpO2 98 % 06/01/22 1156  Vitals shown include unvalidated device data.  Last Pain:  Vitals:   06/01/22 1156  TempSrc: Temporal  PainSc: 0-No pain         Complications: No notable events documented.

## 2022-06-01 NOTE — Anesthesia Preprocedure Evaluation (Signed)
Anesthesia Evaluation  Patient identified by MRN, date of birth, ID band Patient awake    Reviewed: Allergy & Precautions, NPO status , Patient's Chart, lab work & pertinent test results  Airway Mallampati: II  TM Distance: >3 FB Neck ROM: Full    Dental no notable dental hx.    Pulmonary neg pulmonary ROS, former smoker,    Pulmonary exam normal breath sounds clear to auscultation       Cardiovascular hypertension, Pt. on medications Normal cardiovascular exam Rhythm:Regular Rate:Normal     Neuro/Psych negative neurological ROS  negative psych ROS   GI/Hepatic negative GI ROS, Neg liver ROS,   Endo/Other  negative endocrine ROS  Renal/GU negative Renal ROS  negative genitourinary   Musculoskeletal negative musculoskeletal ROS (+)   Abdominal   Peds negative pediatric ROS (+)  Hematology negative hematology ROS (+)   Anesthesia Other Findings   Reproductive/Obstetrics negative OB ROS                             Anesthesia Physical Anesthesia Plan  ASA: 3  Anesthesia Plan: General   Post-op Pain Management:    Induction: Intravenous  PONV Risk Score and Plan:   Airway Management Planned: Natural Airway and Nasal Cannula  Additional Equipment:   Intra-op Plan:   Post-operative Plan:   Informed Consent: I have reviewed the patients History and Physical, chart, labs and discussed the procedure including the risks, benefits and alternatives for the proposed anesthesia with the patient or authorized representative who has indicated his/her understanding and acceptance.     Dental Advisory Given  Plan Discussed with: Anesthesiologist, CRNA and Surgeon  Anesthesia Plan Comments: (Patient consented for risks of anesthesia including but not limited to:  - adverse reactions to medications - risk of airway placement if required - damage to eyes, teeth, lips or other oral  mucosa - nerve damage due to positioning  - sore throat or hoarseness - Damage to heart, brain, nerves, lungs, other parts of body or loss of life  Patient voiced understanding.)        Anesthesia Quick Evaluation

## 2022-06-01 NOTE — Op Note (Signed)
[facility] Patient Name: Heather Curry Procedure Date: 06/01/2022 11:19 AM MRN: [id number] Account #: [id number] Date of Birth: 08/31/1966 Admit Type: Outpatient Age: 56 Room: [facility] ENDO ROOM 2 Gender: Female Note Status: Finalized Instrument Name: Park Meo [id number] Procedure:             Colonoscopy Indications:           High risk colon cancer surveillance: Personal history                         of multiple (3 or more) adenomas, Last colonoscopy:                         September 2021 Providers:             Lin Landsman MD, MD Referring MD:          Lin Landsman MD, MD (Referring MD) Medicines:             General Anesthesia Complications:         No immediate complications. Estimated blood loss: None. Procedure:             Pre-Anesthesia Assessment:                        - Prior to the procedure, a History and Physical was                         performed, and patient medications and allergies were                         reviewed. The patient is competent. The risks and                         benefits of the procedure and the sedation options and                         risks were discussed with the patient. All questions                         were answered and informed consent was obtained.                         Patient identification and proposed procedure were                         verified by the physician, the nurse, the                         anesthesiologist, the anesthetist and the technician                         in the pre-procedure area in the procedure room in the                         endoscopy suite. Mental Status Examination: alert and                         oriented. Airway Examination: normal oropharyngeal  airway and neck mobility. Respiratory Examination:                         clear to auscultation. CV Examination: normal.                         Prophylactic  Antibiotics: The patient does not require                         prophylactic antibiotics. Prior Anticoagulants: The                         patient has taken no previous anticoagulant or                         antiplatelet agents. ASA Grade Assessment: II - A                         patient with mild systemic disease. After reviewing                         the risks and benefits, the patient was deemed in                         satisfactory condition to undergo the procedure. The                         anesthesia plan was to use general anesthesia.                         Immediately prior to administration of medications,                         the patient was re-assessed for adequacy to receive                         sedatives. The heart rate, respiratory rate, oxygen                         saturations, blood pressure, adequacy of pulmonary                         ventilation, and response to care were monitored                         throughout the procedure. The physical status of the                         patient was re-assessed after the procedure.                        After obtaining informed consent, the colonoscope was                         passed under direct vision. Throughout the procedure,                         the patient's blood pressure, pulse, and oxygen  saturations were monitored continuously. The                         Colonoscope was introduced through the anus and                         advanced to the the terminal ileum, with                         identification of the appendiceal orifice and IC                         valve. The colonoscopy was unusually difficult due to                         restricted mobility of the colon. Successful                         completion of the procedure was aided by withdrawing                         the scope and replacing with the pediatric colonoscope                         and  applying abdominal pressure. The patient tolerated                         the procedure well. The quality of the bowel                         preparation was evaluated using the BBPS [facility] Bowel                         Preparation Scale) with scores of: Right Colon = 3,                         Transverse Colon = 3 and Left Colon = 3 (entire mucosa                         seen well with no residual staining, small fragments                         of stool or opaque liquid). The total BBPS score                         equals 9. Findings:      The perianal and digital rectal examinations were normal. Pertinent       negatives include normal sphincter tone and no palpable rectal lesions.      A 9 mm polyp was found in the ascending colon. The polyp was sessile.       The polyp was removed with a cold snare. Resection and retrieval were       complete. To prevent bleeding after the polypectomy, one hemostatic clip       was successfully placed. There was no bleeding during, or at the end, of       the procedure.      A 4 mm polyp was  found in the transverse colon. The polyp was sessile.       The polyp was removed with a cold snare. Resection and retrieval were       complete.      The retroflexed view of the distal rectum and anal verge was normal and       showed no anal or rectal abnormalities.      The terminal ileum appeared normal. Impression:            - One 9 mm polyp in the ascending colon, removed with                         a cold snare. Resected and retrieved. Clip was placed.                        - One 4 mm polyp in the transverse colon, removed with                         a cold snare. Resected and retrieved.                        - The distal rectum and anal verge are normal on                         retroflexion view.                        - The examined portion of the ileum was normal. Recommendation:        - Discharge patient to home (with escort).                         - Resume previous diet today.                        - Continue present medications.                        - Await pathology results.                        - Repeat colonoscopy in 5 years for surveillance based                         on pathology results. Procedure Code(s):     --- Professional ---                        [phone], Colonoscopy, flexible; with removal of                         tumor(s), polyp(s), or other lesion(s) by snare                         technique Diagnosis Code(s):     --- Professional ---                        K63.5, Polyp of colon                        Z86.010, Personal history of colonic  polyps CPT copyright 2019 [employer]. All rights reserved. The codes documented in this report are preliminary and upon coder review may  be revised to meet current compliance requirements. Dr. Ulyess Mort Lin Landsman MD, MD 06/01/2022 11:52:51 AM This report has been signed electronically. Number of Addenda: 0 Note Initiated On: 06/01/2022 11:19 AM Scope Withdrawal Time: 0 hours 14 minutes 26 seconds  Total Procedure Duration: 0 hours 24 minutes 58 seconds  Estimated Blood Loss:  Estimated blood loss: none.      [facility]

## 2022-06-04 ENCOUNTER — Encounter: Payer: Self-pay | Admitting: Gastroenterology

## 2022-06-04 LAB — SURGICAL PATHOLOGY

## 2022-06-29 ENCOUNTER — Encounter: Payer: Self-pay | Admitting: Family Medicine

## 2022-06-29 ENCOUNTER — Ambulatory Visit (INDEPENDENT_AMBULATORY_CARE_PROVIDER_SITE_OTHER): Payer: Managed Care, Other (non HMO) | Admitting: Family Medicine

## 2022-06-29 VITALS — BP 119/79 | HR 62 | Resp 16 | Ht 64.0 in | Wt 161.0 lb

## 2022-06-29 DIAGNOSIS — N951 Menopausal and female climacteric states: Secondary | ICD-10-CM

## 2022-06-29 DIAGNOSIS — I10 Essential (primary) hypertension: Secondary | ICD-10-CM

## 2022-06-29 DIAGNOSIS — Z1231 Encounter for screening mammogram for malignant neoplasm of breast: Secondary | ICD-10-CM | POA: Diagnosis not present

## 2022-06-29 DIAGNOSIS — Z0000 Encounter for general adult medical examination without abnormal findings: Secondary | ICD-10-CM | POA: Diagnosis not present

## 2022-06-29 DIAGNOSIS — Z23 Encounter for immunization: Secondary | ICD-10-CM

## 2022-06-29 DIAGNOSIS — J4520 Mild intermittent asthma, uncomplicated: Secondary | ICD-10-CM

## 2022-06-29 MED ORDER — ALBUTEROL SULFATE HFA 108 (90 BASE) MCG/ACT IN AERS: 2.0000 | INHALATION_SPRAY | Freq: Four times a day (QID) | RESPIRATORY_TRACT | 2 refills | Status: DC | PRN

## 2022-06-29 MED ORDER — PAROXETINE HCL ER 12.5 MG PO TB24: 12.5000 mg | ORAL_TABLET | Freq: Every day | ORAL | 11 refills | Status: DC

## 2022-06-29 MED ORDER — MONTELUKAST SODIUM 10 MG PO TABS: 10.0000 mg | ORAL_TABLET | Freq: Every day | ORAL | 11 refills | Status: DC

## 2022-06-29 MED ORDER — HYDROCHLOROTHIAZIDE 25 MG PO TABS: 25.0000 mg | ORAL_TABLET | Freq: Every day | ORAL | 3 refills | Status: DC

## 2022-06-29 NOTE — Patient Instructions (Signed)
Please call and schedule your mammogram:  [facility]  [address], [address] [facility] [place],  [place]  [address] Get Driving Directions Main: [phone]  Sunday:Closed Monday:7:20 AM - 5:00 PM Tuesday:7:20 AM - 5:00 PM Wednesday:7:20 AM - 5:00 PM Thursday:7:20 AM - 5:00 PM Friday:7:20 AM - 4:30 PM Saturday:Closed  

## 2022-06-29 NOTE — Progress Notes (Signed)
Complete physical exam   Patient: Heather Curry   DOB: [birth year]   56 y.o. Female  MRN: [id number] Visit Date: 06/29/2022  Today's healthcare provider: Jacky Kindle, FNP   I,Tiffany J Bragg,acting as a scribe for Jacky Kindle, FNP.,have documented all relevant documentation on the behalf of Jacky Kindle, FNP,as directed by  Jacky Kindle, FNP while in the presence of Jacky Kindle, FNP.   Chief Complaint  Patient presents with   Annual Exam   Subjective    Heather Curry is a 56 y.o. female who presents today for a complete physical exam.  She reports consuming a general diet. Home exercise routine includes walking with dogs. She generally feels well. She reports sleeping well. She does not have additional problems to discuss today.  HPI   Past Medical History:  Diagnosis Date   Colon polyps    Hypertension    Past Surgical History:  Procedure Laterality Date   APPENDECTOMY     BREAST BIOPSY Right 2007   neg   BREAST BIOPSY Right 09/27/2020   affirm breast bx, x marker, path pending   BREAST SURGERY Left    Lumpectomy   CERVIX REMOVAL     COLONOSCOPY WITH PROPOFOL N/A 04/18/2018   Procedure: COLONOSCOPY WITH PROPOFOL;  Surgeon: Pasty Spillers, MD;  Location: [facility] ENDOSCOPY;  Service: Gastroenterology;  Laterality: N/A;   COLONOSCOPY WITH PROPOFOL N/A 04/17/2019   Procedure: COLONOSCOPY WITH PROPOFOL;  Surgeon: Pasty Spillers, MD;  Location: [facility] ENDOSCOPY;  Service: Endoscopy;  Laterality: N/A;   COLONOSCOPY WITH PROPOFOL N/A 06/01/2020   Procedure: COLONOSCOPY WITH PROPOFOL;  Surgeon: Pasty Spillers, MD;  Location: [facility] ENDOSCOPY;  Service: Endoscopy;  Laterality: N/A;   COLONOSCOPY WITH PROPOFOL N/A 06/01/2022   Procedure: COLONOSCOPY WITH PROPOFOL;  Surgeon: Toney Reil, MD;  Location: [facility] ENDOSCOPY;  Service: Gastroenterology;  Laterality: N/A;   Social History   Socioeconomic History   Marital status: Single    Spouse name: Not  on file   Number of children: Not on file   Years of education: Not on file   Highest education level: Not on file  Occupational History   Not on file  Tobacco Use   Smoking status: Former    Types: Cigarettes    Quit date: 2009    Years since quitting: 14.7   Smokeless tobacco: Never  Vaping Use   Vaping Use: Never used  Substance and Sexual Activity   Alcohol use: Yes    Comment: rarely   Drug use: Never   Sexual activity: Not on file  Other Topics Concern   Not on file  Social History Narrative   Not on file   Social Determinants of Health   Financial Resource Strain: Not on file  Food Insecurity: Not on file  Transportation Needs: Not on file  Physical Activity: Not on file  Stress: Not on file  Social Connections: Not on file  Intimate Partner Violence: Not on file   Family Status  Relation Name Status   Mother  Alive   Father  Deceased   MGM  Deceased   Sister  Alive   Mat Aunt  (Not Specified)   Family History  Problem Relation Age of Onset   Congestive Heart Failure Mother    Hypertension Mother    Arthritis Mother    Alcohol abuse Father    Hypertension Father    COPD Father    Arthritis Maternal  Grandmother    Cancer Maternal Grandmother    Hypertension Sister    Breast cancer Maternal Aunt 50   Allergies  Allergen Reactions   Bee Venom Anaphylaxis   Penicillins     Patient Care Team: Jacky Kindle, FNP as PCP - General (Family Medicine)   Medications: Outpatient Medications Prior to Visit  Medication Sig   [DISCONTINUED] hydrochlorothiazide (HYDRODIURIL) 25 MG tablet Take 1 tablet (25 mg total) by mouth daily.   [DISCONTINUED] traMADol (ULTRAM) 50 MG tablet Take 1 tablet (50 mg total) by mouth every 6 (six) hours as needed.   [DISCONTINUED] valACYclovir (VALTREX) 1000 MG tablet Take 1 tablet (1,000 mg total) by mouth 3 (three) times daily.   No facility-administered medications prior to visit.    Review of Systems   Objective     BP 119/79 (BP Location: Left Arm, Patient Position: Sitting, Cuff Size: Normal)   Pulse 62   Resp 16   Ht 5\' 4"  (1.626 m)   Wt 161 lb (73 kg)   SpO2 99%   BMI 27.64 kg/m   Physical Exam Vitals and nursing note reviewed.  Constitutional:      General: She is awake. She is not in acute distress.    Appearance: Normal appearance. She is well-developed, well-groomed and overweight. She is not ill-appearing, toxic-appearing or diaphoretic.  HENT:     Head: Normocephalic and atraumatic.     Jaw: There is normal jaw occlusion. No trismus, tenderness, swelling or pain on movement.     Right Ear: Hearing, tympanic membrane, ear canal and external ear normal. There is no impacted cerumen.     Left Ear: Hearing, tympanic membrane, ear canal and external ear normal. There is no impacted cerumen.     Nose: Nose normal. No congestion or rhinorrhea.     Right Turbinates: Not enlarged, swollen or pale.     Left Turbinates: Not enlarged, swollen or pale.     Right Sinus: No maxillary sinus tenderness or frontal sinus tenderness.     Left Sinus: No maxillary sinus tenderness or frontal sinus tenderness.     Mouth/Throat:     Lips: Pink.     Mouth: Mucous membranes are moist. No injury.     Tongue: No lesions.     Pharynx: Oropharynx is clear. Uvula midline. No pharyngeal swelling, oropharyngeal exudate, posterior oropharyngeal erythema or uvula swelling.     Tonsils: No tonsillar exudate or tonsillar abscesses.  Eyes:     General: Lids are normal. Lids are everted, no foreign bodies appreciated. Vision grossly intact. Gaze aligned appropriately. No allergic shiner or visual field deficit.       Right eye: No discharge.        Left eye: No discharge.     Extraocular Movements: Extraocular movements intact.     Conjunctiva/sclera: Conjunctivae normal.     Right eye: Right conjunctiva is not injected. No exudate.    Left eye: Left conjunctiva is not injected. No exudate.    Pupils: Pupils are  equal, round, and reactive to light.  Neck:     Thyroid: No thyroid mass, thyromegaly or thyroid tenderness.     Vascular: No carotid bruit.     Trachea: Trachea normal.  Cardiovascular:     Rate and Rhythm: Normal rate and regular rhythm.     Pulses: Normal pulses.          Carotid pulses are 2+ on the right side and 2+ on the left side.  Radial pulses are 2+ on the right side and 2+ on the left side.       Dorsalis pedis pulses are 2+ on the right side and 2+ on the left side.       Posterior tibial pulses are 2+ on the right side and 2+ on the left side.     Heart sounds: Normal heart sounds, S1 normal and S2 normal. No murmur heard.    No friction rub. No gallop.  Pulmonary:     Effort: Pulmonary effort is normal. No respiratory distress.     Breath sounds: Normal breath sounds and air entry. No stridor. No wheezing, rhonchi or rales.  Chest:     Chest wall: No tenderness.  Abdominal:     General: Abdomen is flat. Bowel sounds are normal. There is no distension.     Palpations: Abdomen is soft. There is no mass.     Tenderness: There is no abdominal tenderness. There is no right CVA tenderness, left CVA tenderness, guarding or rebound.     Hernia: No hernia is present.  Genitourinary:    Comments: Exam deferred; denies complaints Musculoskeletal:        General: No swelling, tenderness, deformity or signs of injury. Normal range of motion.     Cervical back: Full passive range of motion without pain, normal range of motion and neck supple. No edema, rigidity or tenderness. No muscular tenderness.     Right lower leg: No edema.     Left lower leg: No edema.  Lymphadenopathy:     Cervical: No cervical adenopathy.     Right cervical: No superficial, deep or posterior cervical adenopathy.    Left cervical: No superficial, deep or posterior cervical adenopathy.  Skin:    General: Skin is warm and dry.     Capillary Refill: Capillary refill takes less than 2 seconds.      Coloration: Skin is not jaundiced or pale.     Findings: No bruising, erythema, lesion or rash.  Neurological:     General: No focal deficit present.     Mental Status: She is alert and oriented to person, place, and time. Mental status is at baseline.     GCS: GCS eye subscore is 4. GCS verbal subscore is 5. GCS motor subscore is 6.     Sensory: Sensation is intact. No sensory deficit.     Motor: Motor function is intact. No weakness.     Coordination: Coordination is intact. Coordination normal.     Gait: Gait is intact. Gait normal.  Psychiatric:        Attention and Perception: Attention and perception normal.        Mood and Affect: Mood and affect normal.        Speech: Speech normal.        Behavior: Behavior normal. Behavior is cooperative.        Thought Content: Thought content normal.        Cognition and Memory: Cognition and memory normal.        Judgment: Judgment normal.    Last depression screening scores    06/29/2022    3:55 PM 06/27/2021    3:57 PM 05/24/2020   11:18 AM  PHQ 2/9 Scores  PHQ - 2 Score 2 2 2   PHQ- 9 Score 3 5 5    Last fall risk screening    06/29/2022    3:55 PM  Fall Risk   Falls in the past year? 0  Number falls in past yr: 0  Injury with Fall? 0  Risk for fall due to : No Fall Risks  Follow up Falls evaluation completed   Last Audit-C alcohol use screening    06/29/2022    3:55 PM  Alcohol Use Disorder Test (AUDIT)  1. How often do you have a drink containing alcohol? 0  2. How many drinks containing alcohol do you have on a typical day when you are drinking? 0  3. How often do you have six or more drinks on one occasion? 0  AUDIT-C Score 0   A score of 3 or more in women, and 4 or more in men indicates increased risk for alcohol abuse, EXCEPT if all of the points are from question 1   Results for orders placed or performed in visit on 06/29/22  CBC with Differential/Platelet  Result Value Ref Range   WBC 8.1 3.4 - 10.8  x10E3/uL   RBC 4.91 3.77 - 5.28 x10E6/uL   Hemoglobin 13.7 11.1 - 15.9 g/dL   Hematocrit 16.1 09.6 - 46.6 %   MCV 85 79 - 97 fL   MCH 27.9 26.6 - 33.0 pg   MCHC 32.8 31.5 - 35.7 g/dL   RDW 04.5 40.9 - 81.1 %   Platelets 323 150 - 450 x10E3/uL   Neutrophils 54 Not Estab. %   Lymphs 37 Not Estab. %   Monocytes 7 Not Estab. %   Eos 2 Not Estab. %   Basos 0 Not Estab. %   Neutrophils Absolute 4.2 1.4 - 7.0 x10E3/uL   Lymphocytes Absolute 3.0 0.7 - 3.1 x10E3/uL   Monocytes Absolute 0.6 0.1 - 0.9 x10E3/uL   EOS (ABSOLUTE) 0.2 0.0 - 0.4 x10E3/uL   Basophils Absolute 0.0 0.0 - 0.2 x10E3/uL   Immature Granulocytes 0 Not Estab. %   Immature Grans (Abs) 0.0 0.0 - 0.1 x10E3/uL  Comprehensive metabolic panel  Result Value Ref Range   Glucose 84 70 - 99 mg/dL   BUN 13 6 - 24 mg/dL   Creatinine, Ser 9.14 0.57 - 1.00 mg/dL   eGFR 87 >78 GN/FAO/1.30   BUN/Creatinine Ratio 16 9 - 23   Sodium 140 134 - 144 mmol/L   Potassium 4.1 3.5 - 5.2 mmol/L   Chloride 101 96 - 106 mmol/L   CO2 25 20 - 29 mmol/L   Calcium 10.8 (H) 8.7 - 10.2 mg/dL   Total Protein 7.3 6.0 - 8.5 g/dL   Albumin 4.6 3.8 - 4.9 g/dL   Globulin, Total 2.7 1.5 - 4.5 g/dL   Albumin/Globulin Ratio 1.7 1.2 - 2.2   Bilirubin Total <0.2 0.0 - 1.2 mg/dL   Alkaline Phosphatase 131 (H) 44 - 121 IU/L   AST 26 0 - 40 IU/L   ALT 35 (H) 0 - 32 IU/L  Lipid panel  Result Value Ref Range   Cholesterol, Total 203 (H) 100 - 199 mg/dL   Triglycerides 68 0 - 149 mg/dL   HDL 41 >86 mg/dL   VLDL Cholesterol Cal 12 5 - 40 mg/dL   LDL Chol Calc (NIH) 578 (H) 0 - 99 mg/dL   Chol/HDL Ratio 5.0 (H) 0.0 - 4.4 ratio  TSH + free T4  Result Value Ref Range   TSH 5.750 (H) 0.450 - 4.500 uIU/mL   Free T4 1.00 0.82 - 1.77 ng/dL    Assessment & Plan    Routine Health Maintenance and Physical Exam  Exercise Activities and Dietary recommendations  Goals   None  Immunization History  Administered Date(s) Administered   Influenza,inj,Quad  PF,6+ Mos 06/29/2022   Tdap 05/05/2019   Zoster Recombinat (Shingrix) 06/29/2022    Health Maintenance  Topic Date Due   COVID-19 Vaccine (1) Never done   Zoster Vaccines- Shingrix (2 of 2) 08/24/2022   MAMMOGRAM  08/31/2022   PAP SMEAR-Modifier  05/04/2024   COLONOSCOPY (Pts 45-31yrs Insurance coverage will need to be confirmed)  06/02/2027   TETANUS/TDAP  05/04/2029   INFLUENZA VACCINE  Completed   Hepatitis C Screening  Completed   HIV Screening  Completed   HPV VACCINES  Aged Out    Discussed health benefits of physical activity, and encouraged her to engage in regular exercise appropriate for her age and condition.  Problem List Items Addressed This Visit       Cardiovascular and Mediastinum   Primary hypertension    Chronic, stable Continue HCTZ 25 mg      Relevant Medications   hydrochlorothiazide (HYDRODIURIL) 25 MG tablet     Respiratory   Mild intermittent asthma without complication    Chronic, stable Start singulair and SABA to assist      Relevant Medications   montelukast (SINGULAIR) 10 MG tablet   albuterol (VENTOLIN HFA) 108 (90 Base) MCG/ACT inhaler     Other   Annual physical exam - Primary    Discussed dental and vision screening Things to do to keep yourself healthy  - Exercise at least 30-45 minutes a day, 3-4 days a week.  - Eat a low-fat diet with lots of fruits and vegetables, up to 7-9 servings per day.  - Seatbelts can save your life. Wear them always.  - Smoke detectors on every level of your home, check batteries every year.  - Eye Doctor - have an eye exam every 1-2 years  - Safe sex - if you may be exposed to STDs, use a condom.  - Alcohol -  If you drink, do it moderately, less than 2 drinks per day.  - Health Care Power of Attorney. Choose someone to speak for you if you are not able.  - Depression is common in our stressful world.If you're feeling down or losing interest in things you normally enjoy, please come in for a visit.   - Violence - If anyone is threatening or hurting you, please call immediately.        Relevant Orders   CBC with Differential/Platelet (Completed)   Comprehensive metabolic panel (Completed)   Lipid panel (Completed)   TSH + free T4 (Completed)   Encounter for screening mammogram for malignant neoplasm of breast    Due for screening for mammogram, denies breast concerns, provided with phone number to call and schedule appointment for mammogram. Encouraged to repeat breast cancer screening every 1-2 years.       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, interfering with sleep and ADLs throughout day d/t "personal summers" Trial of SSRI to assist      Relevant Medications   PARoxetine (PAXIL CR) 12.5 MG 24 hr tablet   Need for influenza vaccination    Consented; VIS made available; no immediate side effects following administration; plan to repeat annually       Relevant Orders   Flu Vaccine QUAD 6+ mos PF IM (Fluarix Quad PF) (Completed)   Need for shingles vaccine    Consented; VIS made available; no immediate side effects following administration; plan to  repeat 2-6 months       Relevant Orders   Zoster Recombinant (Shingrix ) (Completed)     Return in about 1 year (around 06/30/2023) for annual examination.     Leilani Merl, FNP, have reviewed all documentation for this visit. The documentation on 06/30/22 for the exam, diagnosis, procedures, and orders are all accurate and complete.    Jacky Kindle, FNP  [facility] [phone] (phone) [phone] (fax)  [facility]

## 2022-06-30 ENCOUNTER — Other Ambulatory Visit: Payer: Self-pay | Admitting: Family Medicine

## 2022-06-30 DIAGNOSIS — Z1231 Encounter for screening mammogram for malignant neoplasm of breast: Secondary | ICD-10-CM

## 2022-06-30 LAB — CBC WITH DIFFERENTIAL/PLATELET
Basophils Absolute: 0 10*3/uL (ref 0.0–0.2)
Basos: 0 %
EOS (ABSOLUTE): 0.2 10*3/uL (ref 0.0–0.4)
Eos: 2 %
Hematocrit: 41.8 % (ref 34.0–46.6)
Hemoglobin: 13.7 g/dL (ref 11.1–15.9)
Immature Grans (Abs): 0 10*3/uL (ref 0.0–0.1)
Immature Granulocytes: 0 %
Lymphocytes Absolute: 3 10*3/uL (ref 0.7–3.1)
Lymphs: 37 %
MCH: 27.9 pg (ref 26.6–33.0)
MCHC: 32.8 g/dL (ref 31.5–35.7)
MCV: 85 fL (ref 79–97)
Monocytes Absolute: 0.6 10*3/uL (ref 0.1–0.9)
Monocytes: 7 %
Neutrophils Absolute: 4.2 10*3/uL (ref 1.4–7.0)
Neutrophils: 54 %
Platelets: 323 10*3/uL (ref 150–450)
RBC: 4.91 x10E6/uL (ref 3.77–5.28)
RDW: 12.9 % (ref 11.7–15.4)
WBC: 8.1 10*3/uL (ref 3.4–10.8)

## 2022-06-30 LAB — COMPREHENSIVE METABOLIC PANEL
ALT: 35 IU/L — ABNORMAL HIGH (ref 0–32)
AST: 26 IU/L (ref 0–40)
Albumin/Globulin Ratio: 1.7 (ref 1.2–2.2)
Albumin: 4.6 g/dL (ref 3.8–4.9)
Alkaline Phosphatase: 131 IU/L — ABNORMAL HIGH (ref 44–121)
BUN/Creatinine Ratio: 16 (ref 9–23)
BUN: 13 mg/dL (ref 6–24)
Bilirubin Total: <0.2 mg/dL (ref 0.0–1.2)
CO2: 25 mmol/L (ref 20–29)
Calcium: 10.8 mg/dL — ABNORMAL HIGH (ref 8.7–10.2)
Chloride: 101 mmol/L (ref 96–106)
Creatinine, Ser: 0.8 mg/dL (ref 0.57–1.00)
Globulin, Total: 2.7 g/dL (ref 1.5–4.5)
Glucose: 84 mg/dL (ref 70–99)
Potassium: 4.1 mmol/L (ref 3.5–5.2)
Sodium: 140 mmol/L (ref 134–144)
Total Protein: 7.3 g/dL (ref 6.0–8.5)
eGFR: 87 mL/min/{1.73_m2} (ref >59)

## 2022-06-30 LAB — TSH+FREE T4
Free T4: 1 ng/dL (ref 0.82–1.77)
TSH: 5.75 u[IU]/mL — ABNORMAL HIGH (ref 0.450–4.500)

## 2022-06-30 LAB — LIPID PANEL
Chol/HDL Ratio: 5 ratio — ABNORMAL HIGH (ref 0.0–4.4)
Cholesterol, Total: 203 mg/dL — ABNORMAL HIGH (ref 100–199)
HDL: 41 mg/dL
LDL Chol Calc (NIH): 150 mg/dL — ABNORMAL HIGH (ref 0–99)
Triglycerides: 68 mg/dL (ref 0–149)
VLDL Cholesterol Cal: 12 mg/dL (ref 5–40)

## 2022-06-30 MED ORDER — LEVOTHYROXINE SODIUM 50 MCG PO TABS: 50.0000 ug | ORAL_TABLET | Freq: Every day | ORAL | 0 refills | Status: DC

## 2022-06-30 NOTE — Progress Notes (Signed)
Chemistry shows slight elevation in calcium, continued elevation in liver enzymes- alkaline phosphatase and ALT. I recommend diet low in saturated fat and regular exercise - 30 min at least 5 times per week  Cholesterol has increased The 10-year ASCVD risk score (Arnett DK, et al., 2019) is: 3.1%   Values used to calculate the score:     Age: 56 years     Sex: Female     Is Non-Hispanic African American: No     Diabetic: No     Tobacco smoker: No     Systolic Blood Pressure: 326 mmHg     Is BP treated: Yes     HDL Cholesterol: 41 mg/dL     Total Cholesterol: 203 mg/dL Heart attack and stroke risk is 3% estimated within the next 10 years which is low.   Thyroid remains overstimulated and low functioning.Start of thyroid medication called in, low dose, recommend labs in 6-8 weeks following start.  All other labs normal and stable.  Gwyneth Sprout, [facility] [address] [place], [place] [address] [phone] (phone) [phone] (fax)

## 2022-06-30 NOTE — Assessment & Plan Note (Signed)
Consented; VIS made available; no immediate side effects following administration; plan to repeat 2-6 months

## 2022-06-30 NOTE — Assessment & Plan Note (Signed)
Chronic, stable Start singulair and SABA to assist

## 2022-06-30 NOTE — Assessment & Plan Note (Signed)
Discussed dental and vision screening Things to do to keep yourself healthy  - Exercise at least 30-45 minutes a day, 3-4 days a week.  - Eat a low-fat diet with lots of fruits and vegetables, up to 7-9 servings per day.  - Seatbelts can save your life. Wear them always.  - Smoke detectors on every level of your home, check batteries every year.  - Eye Doctor - have an eye exam every 1-2 years  - Safe sex - if you may be exposed to STDs, use a condom.  - Alcohol -  If you drink, do it moderately, less than 2 drinks per day.  - [place]. Choose someone to speak for you if you are not able.  - Depression is common in our stressful world.If you're feeling down or losing interest in things you normally enjoy, please come in for a visit.  - Violence - If anyone is threatening or hurting you, please call immediately.

## 2022-06-30 NOTE — Assessment & Plan Note (Signed)
Acute on chronic, interfering with sleep and ADLs throughout day d/t "personal summers" Trial of SSRI to assist

## 2022-06-30 NOTE — Assessment & Plan Note (Signed)
Chronic, stable Continue HCTZ 25 mg

## 2022-06-30 NOTE — Assessment & Plan Note (Signed)
Consented; VIS made available; no immediate side effects following administration; plan to repeat annually   

## 2022-06-30 NOTE — Assessment & Plan Note (Signed)
Due for screening for mammogram, denies breast concerns, provided with phone number to call and schedule appointment for mammogram. Encouraged to repeat breast cancer screening every 1-2 years.  

## 2022-07-02 ENCOUNTER — Other Ambulatory Visit: Payer: Self-pay

## 2022-07-02 DIAGNOSIS — E0590 Thyrotoxicosis, unspecified without thyrotoxic crisis or storm: Secondary | ICD-10-CM

## 2022-07-02 DIAGNOSIS — R5382 Chronic fatigue, unspecified: Secondary | ICD-10-CM

## 2022-07-04 ENCOUNTER — Other Ambulatory Visit: Payer: Self-pay | Admitting: Family Medicine

## 2022-07-04 DIAGNOSIS — N6489 Other specified disorders of breast: Secondary | ICD-10-CM

## 2022-07-16 ENCOUNTER — Encounter (INDEPENDENT_AMBULATORY_CARE_PROVIDER_SITE_OTHER): Payer: Self-pay

## 2022-07-21 ENCOUNTER — Other Ambulatory Visit: Payer: Self-pay | Admitting: Family Medicine

## 2022-07-21 DIAGNOSIS — N951 Menopausal and female climacteric states: Secondary | ICD-10-CM

## 2022-07-23 NOTE — Telephone Encounter (Signed)
Requested medications are due for refill today.  no  Requested medications are on the active medications list.  yes  Last refill. 06/29/2022 330 11 rf  Future visit scheduled.   yes  Notes to clinic.  Pharmacy comment: REQUEST FOR 90 DAYS PRESCRIPTION. DX Code Needed.     Requested Prescriptions  Pending Prescriptions Disp Refills   PARoxetine (PAXIL-CR) 12.5 MG 24 hr tablet [Pharmacy Med Name: PAROXETINE ER 12.5 MG TABLET] 90 tablet 4    Sig: TAKE 1 TABLET BY MOUTH EVERY DAY     Psychiatry:  Antidepressants - SSRI Passed - 07/21/2022  9:30 AM      Passed - Valid encounter within last 6 months    Recent Outpatient Visits           3 weeks ago Annual physical exam   [facility] Tally Joe T, FNP   3 months ago Herpes zoster without complication   [facility] Myles Gip, DO   1 year ago Annual physical exam   [facility] Gwyneth Sprout, FNP   2 years ago Annual physical exam   [facility] Trinna Post, [place]   3 years ago Annual physical exam   [facility] Trinna Post, [place]       Future Appointments             In 11 months Gwyneth Sprout, [place], [place]

## 2022-07-27 ENCOUNTER — Ambulatory Visit
Admission: RE | Admit: 2022-07-27 | Discharge: 2022-07-27 | Disposition: A | Discharge status: Home / Self Care | Payer: Managed Care, Other (non HMO) | Source: Ambulatory Visit | Attending: Family Medicine

## 2022-07-27 ENCOUNTER — Ambulatory Visit
Admission: RE | Admit: 2022-07-27 | Discharge: 2022-07-27 | Disposition: A | Discharge status: Home / Self Care | Payer: Managed Care, Other (non HMO) | Source: Ambulatory Visit | Attending: Family Medicine | Admitting: Family Medicine

## 2022-07-27 DIAGNOSIS — N6489 Other specified disorders of breast: Secondary | ICD-10-CM

## 2022-07-27 NOTE — Progress Notes (Signed)
Hi Dianne  Normal mammogram; repeat in 1 year.  Please let us know if you have any questions.  Thank you,  Tally Joe, FNP

## 2022-08-08 ENCOUNTER — Encounter: Payer: Self-pay | Admitting: *Deleted

## 2022-08-08 LAB — TSH+FREE T4
Free T4: 1.3 ng/dL (ref 0.82–1.77)
TSH: 1.49 u[IU]/mL (ref 0.450–4.500)

## 2022-08-08 NOTE — Progress Notes (Signed)
Thyroid values have normalized. Gwyneth Sprout, [address] [place], [place] [address] [phone] (phone) [phone] (fax) [place]

## 2022-09-05 ENCOUNTER — Ambulatory Visit (INDEPENDENT_AMBULATORY_CARE_PROVIDER_SITE_OTHER): Payer: Managed Care, Other (non HMO) | Admitting: Family Medicine

## 2022-09-05 DIAGNOSIS — Z23 Encounter for immunization: Secondary | ICD-10-CM | POA: Diagnosis not present

## 2022-09-05 NOTE — Progress Notes (Signed)
Not seen by NP; vaccination provided without complications. Pt tolerated well.  Gwyneth Sprout, [address] [place], [place] [address] [phone] (phone) [phone] (fax) [place]

## 2022-09-22 ENCOUNTER — Other Ambulatory Visit: Payer: Self-pay | Admitting: Family Medicine

## 2022-12-21 ENCOUNTER — Other Ambulatory Visit: Payer: Self-pay | Admitting: Family Medicine

## 2023-02-26 ENCOUNTER — Ambulatory Visit: Payer: Managed Care, Other (non HMO) | Admitting: Family Medicine

## 2023-02-26 ENCOUNTER — Encounter: Payer: Self-pay | Admitting: Family Medicine

## 2023-02-26 VITALS — BP 147/80 | HR 54 | Ht 64.0 in | Wt 173.0 lb

## 2023-02-26 DIAGNOSIS — F339 Major depressive disorder, recurrent, unspecified: Secondary | ICD-10-CM | POA: Insufficient documentation

## 2023-02-26 DIAGNOSIS — E039 Hypothyroidism, unspecified: Secondary | ICD-10-CM | POA: Insufficient documentation

## 2023-02-26 DIAGNOSIS — N951 Menopausal and female climacteric states: Secondary | ICD-10-CM

## 2023-02-26 DIAGNOSIS — M5442 Lumbago with sciatica, left side: Secondary | ICD-10-CM | POA: Diagnosis not present

## 2023-02-26 DIAGNOSIS — F4312 Post-traumatic stress disorder, chronic: Secondary | ICD-10-CM

## 2023-02-26 DIAGNOSIS — R635 Abnormal weight gain: Secondary | ICD-10-CM | POA: Insufficient documentation

## 2023-02-26 DIAGNOSIS — R5382 Chronic fatigue, unspecified: Secondary | ICD-10-CM | POA: Diagnosis not present

## 2023-02-26 DIAGNOSIS — F4310 Post-traumatic stress disorder, unspecified: Secondary | ICD-10-CM | POA: Insufficient documentation

## 2023-02-26 DIAGNOSIS — I10 Essential (primary) hypertension: Secondary | ICD-10-CM | POA: Diagnosis not present

## 2023-02-26 MED ORDER — PREDNISONE 50 MG PO TABS
50.0000 mg | ORAL_TABLET | Freq: Every day | ORAL | 0 refills | Status: DC
Start: 2023-02-26 — End: 2023-03-19

## 2023-02-26 MED ORDER — MELOXICAM 15 MG PO TABS
15.0000 mg | ORAL_TABLET | Freq: Every day | ORAL | 0 refills | Status: DC
Start: 2023-02-26 — End: 2023-03-19

## 2023-02-26 MED ORDER — TIZANIDINE HCL 4 MG PO TABS
4.0000 mg | ORAL_TABLET | Freq: Four times a day (QID) | ORAL | 0 refills | Status: DC | PRN
Start: 2023-02-26 — End: 2023-03-19

## 2023-02-26 NOTE — Progress Notes (Signed)
Established patient visit  Patient: Heather Curry   DOB: 1966-05-20   57 y.o. Female  MRN: [id number] Visit Date: 02/26/2023  Today's healthcare provider: Jacky Kindle, FNP  Re Introduced to nurse practitioner role and practice setting.  All questions answered.  Discussed provider/patient relationship and expectations.  Chief Complaint  Patient presents with   Leg Pain    Pt stated--left upper leg radiated lower leg--burning sensation, pain--especially when having lower back pain, sitting, walking-2 -3 weeks.   Subjective    HPI HPI     Leg Pain    Additional comments: Pt stated--left upper leg radiated lower leg--burning sensation, pain--especially when having lower back pain, sitting, walking-2 -3 weeks.      Last edited by Shelly Bombard, CMA on 02/26/2023  8:36 AM.      Medications: Outpatient Medications Prior to Visit  Medication Sig   albuterol (VENTOLIN HFA) 108 (90 Base) MCG/ACT inhaler Inhale 2 puffs into the lungs every 6 (six) hours as needed for wheezing or shortness of breath.   hydrochlorothiazide (HYDRODIURIL) 25 MG tablet Take 1 tablet (25 mg total) by mouth daily.   levothyroxine (SYNTHROID) 50 MCG tablet TAKE 1 TABLET (50 MCG TOTAL) BY MOUTH DAILY. TAKE WITH WATER, WITHOUT OTHER MEDICATIONS/FOOD OR DRINK. REPEAT LABS IN 6-8 WEEKS.   montelukast (SINGULAIR) 10 MG tablet Take 1 tablet (10 mg total) by mouth at bedtime.   PARoxetine (PAXIL-CR) 12.5 MG 24 hr tablet TAKE 1 TABLET BY MOUTH EVERY DAY   No facility-administered medications prior to visit.    Review of Systems  Last CBC Lab Results  Component Value Date   WBC 8.1 06/29/2022   HGB 13.7 06/29/2022   HCT 41.8 06/29/2022   MCV 85 06/29/2022   MCH 27.9 06/29/2022   RDW 12.9 06/29/2022   PLT 323 06/29/2022   Last metabolic panel Lab Results  Component Value Date   GLUCOSE 84 06/29/2022   NA 140 06/29/2022   K 4.1 06/29/2022   CL 101 06/29/2022   CO2 25 06/29/2022   BUN 13 06/29/2022    CREATININE 0.80 06/29/2022   EGFR 87 06/29/2022   CALCIUM 10.8 (H) 06/29/2022   PROT 7.3 06/29/2022   ALBUMIN 4.6 06/29/2022   LABGLOB 2.7 06/29/2022   AGRATIO 1.7 06/29/2022   BILITOT <0.2 06/29/2022   ALKPHOS 131 (H) 06/29/2022   AST 26 06/29/2022   ALT 35 (H) 06/29/2022   Last lipids Lab Results  Component Value Date   CHOL 203 (H) 06/29/2022   HDL 41 06/29/2022   LDLCALC 150 (H) 06/29/2022   TRIG 68 06/29/2022   CHOLHDL 5.0 (H) 06/29/2022   Last hemoglobin A1c Lab Results  Component Value Date   HGBA1C 5.5 06/27/2021   Last thyroid functions Lab Results  Component Value Date   TSH 1.490 08/07/2022   T4TOTAL 9.3 05/24/2020     Objective    BP (!) 147/80   Pulse (!) 54   Ht 5\' 4"  (1.626 m)   Wt 173 lb (78.5 kg)   SpO2 98%   BMI 29.70 kg/m   BP Readings from Last 3 Encounters:  02/26/23 (!) 147/80  06/29/22 119/79  06/01/22 (!) 120/99   Wt Readings from Last 3 Encounters:  02/26/23 173 lb (78.5 kg)  06/29/22 161 lb (73 kg)  06/01/22 155 lb (70.3 kg)   SpO2 Readings from Last 3 Encounters:  02/26/23 98%  06/29/22 99%  06/01/22 100%      Physical Exam Vitals  and nursing note reviewed.  Constitutional:      General: She is not in acute distress.    Appearance: Normal appearance. She is overweight. She is not ill-appearing, toxic-appearing or diaphoretic.  HENT:     Head: Normocephalic and atraumatic.  Cardiovascular:     Rate and Rhythm: Regular rhythm. Bradycardia present.     Pulses: Normal pulses.     Heart sounds: Normal heart sounds. No murmur heard.    No friction rub. No gallop.  Pulmonary:     Effort: Pulmonary effort is normal. No respiratory distress.     Breath sounds: Normal breath sounds. No stridor. No wheezing, rhonchi or rales.  Chest:     Chest wall: No tenderness.  Abdominal:     General: Bowel sounds are normal.     Palpations: Abdomen is soft.  Musculoskeletal:        General: Tenderness present. No swelling, deformity  or signs of injury. Normal range of motion.     Right lower leg: No edema.     Left lower leg: No edema.       Legs:  Skin:    General: Skin is warm and dry.     Capillary Refill: Capillary refill takes less than 2 seconds.     Coloration: Skin is not jaundiced or pale.     Findings: No bruising, erythema, lesion or rash.  Neurological:     General: No focal deficit present.     Mental Status: She is alert and oriented to person, place, and time. Mental status is at baseline.     Cranial Nerves: No cranial nerve deficit.     Sensory: No sensory deficit.     Motor: No weakness.     Coordination: Coordination normal.  Psychiatric:        Mood and Affect: Mood normal.        Behavior: Behavior normal.        Thought Content: Thought content normal.        Judgment: Judgment normal.     No results found for any visits on 02/26/23.  Assessment & Plan     Problem List Items Addressed This Visit       Cardiovascular and Mediastinum   Primary hypertension    Chronic, elevated today Previously stable Weight gain finding also noted Goal of <139/<79 Remains on hctz 25 mg; pt notes that she is not making as much urine with medication as she was previously      Relevant Orders   Comprehensive Metabolic Panel (CMET)   Lipid panel   CBC   Vitamin B1   Vitamin B6   B12     Endocrine   Hypothyroidism    Chronic, previously stable Repeat labs in light of weight gain       Relevant Orders   TSH + free T4     Nervous and Auditory   Acute left-sided low back pain with left-sided sciatica - Primary    Acute, self limiting No toxic appearance Appears uncomfortable; however, no use of assistive devices needed Not improved with NSAIDs otc alone; recommend combination of  -mobic -OTC APAP up to 1000 mg TID PRN -zanaflex -short burst of pred  Exercise provided; denies loss of bowel or bladder function Legs remain symmetric; no change in appearance; no change in pulsatility  etc       Relevant Medications   predniSONE (DELTASONE) 50 MG tablet   tiZANidine (ZANAFLEX) 4 MG tablet   meloxicam (MOBIC)  15 MG tablet     Other   Chronic fatigue    Chronic, unknown cause Repeat labs      Relevant Orders   Vitamin D (25 hydroxy)   Chronic major depressive disorder, recurrent episode (HCC)    Continues to work with therapist Happy with current mgmt of vasomotor symptoms with use of paxil 12.5 mg       Chronic post-traumatic stress disorder (PTSD)    Continues to work with therapist Happy with current mgmt of vasomotor symptoms with use of paxil 12.5 mg       Menopausal vasomotor syndrome    Chronic, ongoing Wishes to stay on low dose paxil 12.5 mg despite weight gain Continues to work with therapist       Weight gain finding    Recommend A1c given previous labs at 5.5% Continue to recommend balanced, lower carb meals. Smaller meal size, adding snacks. Choosing water as drink of choice and increasing purposeful exercise. Unclear cause at this time      Relevant Orders   Hemoglobin A1c   Return in about 2 weeks (around 03/12/2023) for low back pain/strain.     Leilani Merl, FNP, have reviewed all documentation for this visit. The documentation on 02/26/23 for the exam, diagnosis, procedures, and orders are all accurate and complete.  Jacky Kindle, FNP  [facility] [phone] (phone) [phone] (fax)  [facility]

## 2023-02-26 NOTE — Assessment & Plan Note (Signed)
Continues to work with therapist Happy with current mgmt of vasomotor symptoms with use of paxil 12.5 mg

## 2023-02-26 NOTE — Assessment & Plan Note (Signed)
Chronic, previously stable Repeat labs in light of weight gain

## 2023-02-26 NOTE — Assessment & Plan Note (Signed)
Chronic, unknown cause Repeat labs

## 2023-02-26 NOTE — Assessment & Plan Note (Signed)
Chronic, ongoing Wishes to stay on low dose paxil 12.5 mg despite weight gain Continues to work with therapist

## 2023-02-26 NOTE — Assessment & Plan Note (Signed)
Acute, self limiting No toxic appearance Appears uncomfortable; however, no use of assistive devices needed Not improved with NSAIDs otc alone; recommend combination of  -mobic -OTC APAP up to 1000 mg TID PRN -zanaflex -short burst of pred  Exercise provided; denies loss of bowel or bladder function Legs remain symmetric; no change in appearance; no change in pulsatility etc

## 2023-02-26 NOTE — Assessment & Plan Note (Signed)
Recommend A1c given previous labs at 5.5% Continue to recommend balanced, lower carb meals. Smaller meal size, adding snacks. Choosing water as drink of choice and increasing purposeful exercise. Unclear cause at this time

## 2023-02-26 NOTE — Assessment & Plan Note (Signed)
Chronic, elevated today Previously stable Weight gain finding also noted Goal of <139/<79 Remains on hctz 25 mg; pt notes that she is not making as much urine with medication as she was previously

## 2023-02-26 NOTE — Patient Instructions (Addendum)
Prednisone daily x5 days Use of mobic in AM with food/milk Trial of zanaflex evening- see how you do side effects OK for OTC tylenol Hold ibuprofen and naproxen with use of mobic

## 2023-02-27 NOTE — Progress Notes (Signed)
Increase noted in cholesterol; The 10-year ASCVD risk score (Arnett DK, et al., 2019) is: 5.6% Stroke and Heart attack risk is increased; I continue to recommend diet low in saturated fat and regular exercise - 30 min at least 5 times per week TSH is slightly increased; continue to monitor symptoms; defer additional changes at this time without symptoms. Repeat labs in 3 months- TSH alone. Recommend vit d 3 supplement 5000 IU daily.   Some B vitamin labs pending.

## 2023-02-28 ENCOUNTER — Other Ambulatory Visit: Payer: Self-pay | Admitting: Family Medicine

## 2023-02-28 LAB — HEMOGLOBIN A1C
Est. average glucose Bld gHb Est-mCnc: 108 mg/dL
Hgb A1c MFr Bld: 5.4 % (ref 4.8–5.6)

## 2023-02-28 MED ORDER — LEVOTHYROXINE SODIUM 75 MCG PO TABS: 75.0000 ug | ORAL_TABLET | Freq: Every day | ORAL | 0 refills | Status: DC

## 2023-03-01 LAB — LIPID PANEL
Chol/HDL Ratio: 5.5 ratio — ABNORMAL HIGH (ref 0.0–4.4)
Cholesterol, Total: 215 mg/dL — ABNORMAL HIGH (ref 100–199)
HDL: 39 mg/dL — ABNORMAL LOW (ref >39)
LDL Chol Calc (NIH): 163 mg/dL — ABNORMAL HIGH (ref 0–99)
Triglycerides: 75 mg/dL (ref 0–149)
VLDL Cholesterol Cal: 13 mg/dL (ref 5–40)

## 2023-03-01 LAB — COMPREHENSIVE METABOLIC PANEL
ALT: 24 IU/L (ref 0–32)
AST: 16 IU/L (ref 0–40)
Albumin/Globulin Ratio: 1.9
Albumin: 4.3 g/dL (ref 3.8–4.9)
Alkaline Phosphatase: 131 IU/L — ABNORMAL HIGH (ref 44–121)
BUN/Creatinine Ratio: 25 — ABNORMAL HIGH (ref 9–23)
BUN: 19 mg/dL (ref 6–24)
Bilirubin Total: 0.3 mg/dL (ref 0.0–1.2)
CO2: 22 mmol/L (ref 20–29)
Calcium: 9.9 mg/dL (ref 8.7–10.2)
Chloride: 105 mmol/L (ref 96–106)
Creatinine, Ser: 0.77 mg/dL (ref 0.57–1.00)
Globulin, Total: 2.3 g/dL (ref 1.5–4.5)
Glucose: 86 mg/dL (ref 70–99)
Potassium: 4.7 mmol/L (ref 3.5–5.2)
Sodium: 140 mmol/L (ref 134–144)
Total Protein: 6.6 g/dL (ref 6.0–8.5)
eGFR: 90 mL/min/{1.73_m2} (ref >59)

## 2023-03-01 LAB — CBC
Hematocrit: 39.2 % (ref 34.0–46.6)
Hemoglobin: 12.5 g/dL (ref 11.1–15.9)
MCH: 27.4 pg (ref 26.6–33.0)
MCHC: 31.9 g/dL (ref 31.5–35.7)
MCV: 86 fL (ref 79–97)
Platelets: 253 10*3/uL (ref 150–450)
RBC: 4.57 x10E6/uL (ref 3.77–5.28)
RDW: 13.2 % (ref 11.7–15.4)
WBC: 5.9 10*3/uL (ref 3.4–10.8)

## 2023-03-01 LAB — TSH+FREE T4
Free T4: 0.84 ng/dL (ref 0.82–1.77)
TSH: 6.57 u[IU]/mL — ABNORMAL HIGH (ref 0.450–4.500)

## 2023-03-01 LAB — VITAMIN B6: Vitamin B6: 7.1 ug/L (ref 3.4–65.2)

## 2023-03-01 LAB — VITAMIN B1: Thiamine: 98.7 nmol/L (ref 66.5–200.0)

## 2023-03-01 LAB — VITAMIN B12: Vitamin B-12: 467 pg/mL (ref 232–1245)

## 2023-03-01 LAB — VITAMIN D 25 HYDROXY (VIT D DEFICIENCY, FRACTURES): Vit D, 25-Hydroxy: 18.1 ng/mL — ABNORMAL LOW (ref 30.0–100.0)

## 2023-03-01 NOTE — Progress Notes (Signed)
B vitamins stable

## 2023-03-19 ENCOUNTER — Ambulatory Visit: Payer: Managed Care, Other (non HMO) | Admitting: Family Medicine

## 2023-03-19 ENCOUNTER — Encounter: Payer: Self-pay | Admitting: Family Medicine

## 2023-03-19 DIAGNOSIS — M5442 Lumbago with sciatica, left side: Secondary | ICD-10-CM | POA: Diagnosis not present

## 2023-03-19 MED ORDER — TIZANIDINE HCL 4 MG PO TABS
4.0000 mg | ORAL_TABLET | Freq: Four times a day (QID) | ORAL | 0 refills | Status: DC | PRN
Start: 2023-03-19 — End: 2023-07-18

## 2023-03-19 MED ORDER — MELOXICAM 15 MG PO TABS: 15.0000 mg | ORAL_TABLET | Freq: Every day | ORAL | 0 refills | Status: DC

## 2023-03-19 NOTE — Progress Notes (Signed)
Established patient visit   Patient: Heather Curry   DOB: Mar 03, 1966   57 y.o. Female  MRN: [id number] Visit Date: 03/19/2023  Today's healthcare provider: Jacky Kindle, FNP  Re Introduced to nurse practitioner role and practice setting.  All questions answered.  Discussed provider/patient relationship and expectations.   Chief Complaint  Patient presents with   Back Pain   Subjective    HPI HPI   02/26/23 patient started on -Mobic. OTC APAP up to 1000 mg TID PRN. Zanaflex and short burst of pred. Patient reports medication helped a lot. She has completed all of her medications. She reports she still has burinng pain from lower back down her hip and ankle.   Last edited by [name], CMA on 03/19/2023  3:50 PM.       Medications: Outpatient Medications Prior to Visit  Medication Sig   albuterol (VENTOLIN HFA) 108 (90 Base) MCG/ACT inhaler Inhale 2 puffs into the lungs every 6 (six) hours as needed for wheezing or shortness of breath.   aspirin EC 81 MG tablet Take 81 mg by mouth daily. Swallow whole.   hydrochlorothiazide (HYDRODIURIL) 25 MG tablet Take 1 tablet (25 mg total) by mouth daily.   levothyroxine (SYNTHROID) 75 MCG tablet Take 1 tablet (75 mcg total) by mouth daily.   montelukast (SINGULAIR) 10 MG tablet Take 1 tablet (10 mg total) by mouth at bedtime.   PARoxetine (PAXIL-CR) 12.5 MG 24 hr tablet TAKE 1 TABLET BY MOUTH EVERY DAY   [DISCONTINUED] meloxicam (MOBIC) 15 MG tablet Take 1 tablet (15 mg total) by mouth daily.   [DISCONTINUED] predniSONE (DELTASONE) 50 MG tablet Take 1 tablet (50 mg total) by mouth daily with breakfast.   [DISCONTINUED] tiZANidine (ZANAFLEX) 4 MG tablet Take 1 tablet (4 mg total) by mouth every 6 (six) hours as needed for muscle spasms.   No facility-administered medications prior to visit.    Review of Systems    Objective    BP 119/73 (BP Location: Left Arm, Patient Position: Sitting, Cuff Size: Large)   Pulse 65    Temp 98.2 F (36.8 C) (Temporal)   Resp 12   Ht 5\' 4"  (1.626 m)   Wt 170 lb 12.8 oz (77.5 kg)   SpO2 99%   BMI 29.32 kg/m  BP Readings from Last 3 Encounters:  03/19/23 119/73  02/26/23 (!) 147/80  06/29/22 119/79   Wt Readings from Last 3 Encounters:  03/19/23 170 lb 12.8 oz (77.5 kg)  02/26/23 173 lb (78.5 kg)  06/29/22 161 lb (73 kg)      Physical Exam Vitals and nursing note reviewed.  Constitutional:      General: She is not in acute distress.    Appearance: Normal appearance. She is overweight. She is not ill-appearing, toxic-appearing or diaphoretic.  HENT:     Head: Normocephalic and atraumatic.  Cardiovascular:     Rate and Rhythm: Normal rate and regular rhythm.     Pulses: Normal pulses.     Heart sounds: Normal heart sounds. No murmur heard.    No friction rub. No gallop.  Pulmonary:     Effort: Pulmonary effort is normal. No respiratory distress.     Breath sounds: Normal breath sounds. No stridor. No wheezing, rhonchi or rales.  Chest:     Chest wall: No tenderness.  Musculoskeletal:        General: Tenderness present. No swelling, deformity or signs of injury. Normal range of motion.  Right lower leg: No edema.     Left lower leg: No edema.     Comments: Improved; however, exacerbated from yard work over the w/e  Skin:    General: Skin is warm and dry.     Capillary Refill: Capillary refill takes less than 2 seconds.     Coloration: Skin is not jaundiced or pale.     Findings: No bruising, erythema, lesion or rash.  Neurological:     General: No focal deficit present.     Mental Status: She is alert and oriented to person, place, and time. Mental status is at baseline.     Cranial Nerves: No cranial nerve deficit.     Sensory: No sensory deficit.     Motor: No weakness.     Coordination: Coordination normal.  Psychiatric:        Mood and Affect: Mood normal.        Behavior: Behavior normal.        Thought Content: Thought content normal.         Judgment: Judgment normal.      No results found for any visits on 03/19/23.  Assessment & Plan     Problem List Items Addressed This Visit       Nervous and Auditory   Acute left-sided low back pain with left-sided sciatica    Acute, self limiting No toxic appearance Appears uncomfortable; however, no use of assistive devices needed Not improved with NSAIDs otc alone; recommend combination of  -mobic -OTC APAP up to 1000 mg TID PRN -zanaflex -short burst of pred  Exercise provided; denies loss of bowel or bladder function Legs remain symmetric; no change in appearance; no change in pulsatility etc   Refills provided of zanaflex and mobic; referral placed to ortho. Continue previously recommended exercises/stretches.      Relevant Medications   aspirin EC 81 MG tablet   tiZANidine (ZANAFLEX) 4 MG tablet   meloxicam (MOBIC) 15 MG tablet   Other Relevant Orders   Ambulatory referral to Orthopedics   No follow-ups on file.     Leilani Merl, FNP, have reviewed all documentation for this visit. The documentation on 03/19/23 for the exam, diagnosis, procedures, and orders are all accurate and complete.  Jacky Kindle, FNP  [facility] [phone] (phone) [phone] (fax)  [facility]

## 2023-03-19 NOTE — Assessment & Plan Note (Signed)
Acute, self limiting No toxic appearance Appears uncomfortable; however, no use of assistive devices needed Not improved with NSAIDs otc alone; recommend combination of  -mobic -OTC APAP up to 1000 mg TID PRN -zanaflex -short burst of pred  Exercise provided; denies loss of bowel or bladder function Legs remain symmetric; no change in appearance; no change in pulsatility etc   Refills provided of zanaflex and mobic; referral placed to ortho. Continue previously recommended exercises/stretches.

## 2023-04-09 ENCOUNTER — Other Ambulatory Visit: Payer: Self-pay | Admitting: Student

## 2023-04-09 DIAGNOSIS — M5416 Radiculopathy, lumbar region: Secondary | ICD-10-CM

## 2023-04-09 DIAGNOSIS — M549 Dorsalgia, unspecified: Secondary | ICD-10-CM

## 2023-04-20 ENCOUNTER — Other Ambulatory Visit: Payer: Managed Care, Other (non HMO)

## 2023-04-23 ENCOUNTER — Other Ambulatory Visit: Payer: Self-pay | Admitting: Family Medicine

## 2023-04-23 DIAGNOSIS — M5442 Lumbago with sciatica, left side: Secondary | ICD-10-CM

## 2023-05-04 ENCOUNTER — Ambulatory Visit
Admission: RE | Admit: 2023-05-04 | Discharge: 2023-05-04 | Disposition: A | Discharge status: Home / Self Care | Payer: Managed Care, Other (non HMO) | Source: Ambulatory Visit | Attending: Student | Admitting: Student

## 2023-05-04 DIAGNOSIS — M549 Dorsalgia, unspecified: Secondary | ICD-10-CM

## 2023-05-04 DIAGNOSIS — M5416 Radiculopathy, lumbar region: Secondary | ICD-10-CM

## 2023-06-02 ENCOUNTER — Other Ambulatory Visit: Payer: Self-pay | Admitting: Family Medicine

## 2023-06-19 ENCOUNTER — Other Ambulatory Visit: Payer: Self-pay | Admitting: Family Medicine

## 2023-06-19 DIAGNOSIS — J4520 Mild intermittent asthma, uncomplicated: Secondary | ICD-10-CM

## 2023-06-19 NOTE — Telephone Encounter (Signed)
Requested Prescriptions  Pending Prescriptions Disp Refills   montelukast (SINGULAIR) 10 MG tablet [Pharmacy Med Name: MONTELUKAST SOD 10 MG TABLET] 90 tablet 3    Sig: TAKE 1 TABLET BY MOUTH EVERYDAY AT BEDTIME     Pulmonology:  Leukotriene Inhibitors Passed - 06/19/2023  1:29 AM      Passed - Valid encounter within last 12 months    Recent Outpatient Visits           3 months ago Acute left-sided low back pain with left-sided sciatica   [facility] Merita Norton T, FNP   3 months ago Acute left-sided low back pain with left-sided sciatica   [facility] Jacky Kindle, FNP   11 months ago Annual physical exam   [facility] Merita Norton T, FNP   1 year ago Herpes zoster without complication   [facility] Caro Laroche, DO   1 year ago Annual physical exam   [facility] Jacky Kindle, FNP       Future Appointments             In 1 week Jacky Kindle, FNP [facility], [facility]

## 2023-07-01 ENCOUNTER — Encounter: Payer: Self-pay | Admitting: Family Medicine

## 2023-07-01 ENCOUNTER — Ambulatory Visit: Payer: Managed Care, Other (non HMO) | Admitting: Family Medicine

## 2023-07-01 VITALS — BP 125/88 | HR 51 | Temp 97.5°F | Resp 16 | Ht 64.0 in | Wt 171.9 lb

## 2023-07-01 DIAGNOSIS — Z1231 Encounter for screening mammogram for malignant neoplasm of breast: Secondary | ICD-10-CM | POA: Diagnosis not present

## 2023-07-01 DIAGNOSIS — M5416 Radiculopathy, lumbar region: Secondary | ICD-10-CM | POA: Insufficient documentation

## 2023-07-01 DIAGNOSIS — Z23 Encounter for immunization: Secondary | ICD-10-CM | POA: Diagnosis not present

## 2023-07-01 DIAGNOSIS — Z0000 Encounter for general adult medical examination without abnormal findings: Secondary | ICD-10-CM

## 2023-07-01 DIAGNOSIS — M51362 Other intervertebral disc degeneration, lumbar region with discogenic back pain and lower extremity pain: Secondary | ICD-10-CM | POA: Insufficient documentation

## 2023-07-01 DIAGNOSIS — R748 Abnormal levels of other serum enzymes: Secondary | ICD-10-CM

## 2023-07-01 DIAGNOSIS — R7309 Other abnormal glucose: Secondary | ICD-10-CM

## 2023-07-01 DIAGNOSIS — N951 Menopausal and female climacteric states: Secondary | ICD-10-CM

## 2023-07-01 DIAGNOSIS — E782 Mixed hyperlipidemia: Secondary | ICD-10-CM | POA: Insufficient documentation

## 2023-07-01 DIAGNOSIS — Z0001 Encounter for general adult medical examination with abnormal findings: Secondary | ICD-10-CM

## 2023-07-01 DIAGNOSIS — E039 Hypothyroidism, unspecified: Secondary | ICD-10-CM

## 2023-07-01 DIAGNOSIS — F339 Major depressive disorder, recurrent, unspecified: Secondary | ICD-10-CM

## 2023-07-01 DIAGNOSIS — M48062 Spinal stenosis, lumbar region with neurogenic claudication: Secondary | ICD-10-CM | POA: Insufficient documentation

## 2023-07-01 DIAGNOSIS — E559 Vitamin D deficiency, unspecified: Secondary | ICD-10-CM

## 2023-07-01 DIAGNOSIS — I10 Essential (primary) hypertension: Secondary | ICD-10-CM

## 2023-07-01 DIAGNOSIS — F4312 Post-traumatic stress disorder, chronic: Secondary | ICD-10-CM

## 2023-07-01 MED ORDER — HYDROCHLOROTHIAZIDE 25 MG PO TABS: 25.0000 mg | ORAL_TABLET | Freq: Every day | ORAL | 3 refills | Status: AC

## 2023-07-01 MED ORDER — PAROXETINE HCL ER 12.5 MG PO TB24
12.5000 mg | ORAL_TABLET | Freq: Every day | ORAL | 3 refills | Status: DC
Start: 2023-07-01 — End: 2024-04-14
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
## 2023-07-01 NOTE — Patient Instructions (Signed)
Please call and schedule your mammogram:  [facility]  [address], [address] [facility] [place],  [place]  [address] Get Driving Directions Main: [phone]  Sunday:Closed Monday:7:20 AM - 5:00 PM Tuesday:7:20 AM - 5:00 PM Wednesday:7:20 AM - 5:00 PM Thursday:7:20 AM - 5:00 PM Friday:7:20 AM - 4:30 PM Saturday:Closed

## 2023-07-02 LAB — VITAMIN D 25 HYDROXY (VIT D DEFICIENCY, FRACTURES): Vit D, 25-Hydroxy: 20.5 ng/mL — ABNORMAL LOW (ref 30.0–100.0)

## 2023-07-02 LAB — COMPREHENSIVE METABOLIC PANEL
ALT: 53 [IU]/L — ABNORMAL HIGH (ref 0–32)
AST: 21 [IU]/L (ref 0–40)
Albumin: 4.6 g/dL (ref 3.8–4.9)
Alkaline Phosphatase: 174 [IU]/L — ABNORMAL HIGH (ref 44–121)
BUN/Creatinine Ratio: 24 — ABNORMAL HIGH (ref 9–23)
BUN: 20 mg/dL (ref 6–24)
Bilirubin Total: 0.2 mg/dL (ref 0.0–1.2)
CO2: 25 mmol/L (ref 20–29)
Calcium: 10.8 mg/dL — ABNORMAL HIGH (ref 8.7–10.2)
Chloride: 104 mmol/L (ref 96–106)
Creatinine, Ser: 0.82 mg/dL (ref 0.57–1.00)
Globulin, Total: 2.3 g/dL (ref 1.5–4.5)
Glucose: 89 mg/dL (ref 70–99)
Potassium: 4.4 mmol/L (ref 3.5–5.2)
Sodium: 140 mmol/L (ref 134–144)
Total Protein: 6.9 g/dL (ref 6.0–8.5)
eGFR: 84 mL/min/{1.73_m2} (ref >59)

## 2023-07-02 LAB — LIPID PANEL
Chol/HDL Ratio: 5.4 {ratio} — ABNORMAL HIGH (ref 0.0–4.4)
Cholesterol, Total: 220 mg/dL — ABNORMAL HIGH (ref 100–199)
HDL: 41 mg/dL (ref >39)
LDL Chol Calc (NIH): 166 mg/dL — ABNORMAL HIGH (ref 0–99)
Triglycerides: 76 mg/dL (ref 0–149)
VLDL Cholesterol Cal: 13 mg/dL (ref 5–40)

## 2023-07-02 LAB — CBC WITH DIFFERENTIAL/PLATELET
Basophils Absolute: 0 10*3/uL (ref 0.0–0.2)
Basos: 0 %
EOS (ABSOLUTE): 0.2 10*3/uL (ref 0.0–0.4)
Eos: 3 %
Hematocrit: 40.7 % (ref 34.0–46.6)
Hemoglobin: 13.3 g/dL (ref 11.1–15.9)
Immature Grans (Abs): 0 10*3/uL (ref 0.0–0.1)
Immature Granulocytes: 0 %
Lymphocytes Absolute: 2.9 10*3/uL (ref 0.7–3.1)
Lymphs: 36 %
MCH: 27.8 pg (ref 26.6–33.0)
MCHC: 32.7 g/dL (ref 31.5–35.7)
MCV: 85 fL (ref 79–97)
Monocytes Absolute: 0.7 10*3/uL (ref 0.1–0.9)
Monocytes: 9 %
Neutrophils Absolute: 4.2 10*3/uL (ref 1.4–7.0)
Neutrophils: 52 %
Platelets: 288 10*3/uL (ref 150–450)
RBC: 4.78 x10E6/uL (ref 3.77–5.28)
RDW: 12.5 % (ref 11.7–15.4)
WBC: 8 10*3/uL (ref 3.4–10.8)

## 2023-07-02 LAB — TSH: TSH: 3.59 u[IU]/mL (ref 0.450–4.500)

## 2023-07-02 LAB — HEMOGLOBIN A1C
Est. average glucose Bld gHb Est-mCnc: 108 mg/dL
Hgb A1c MFr Bld: 5.4 % (ref 4.8–5.6)

## 2023-07-02 NOTE — Progress Notes (Signed)
Vit D is improved but remains low; continue to recommend high dose supplementation of 5000 IU Vit d 3 daily with meal with plan to repeat labs in 6 months.  Chronic elevation in alkaline phosphatase remains; and interval elevation of ALT. I continue to recommend diet low in saturated fat and regular exercise - 30 min at least 5 times per week. If desired, referral to GI to get a fibrosis scan to evaluate liver can be placed.  Cholesterol has increased; The 10-year ASCVD risk score (Arnett DK, et al., 2019) is: 4%.

## 2023-07-03 ENCOUNTER — Other Ambulatory Visit: Payer: Self-pay

## 2023-07-03 DIAGNOSIS — K760 Fatty (change of) liver, not elsewhere classified: Secondary | ICD-10-CM

## 2023-07-06 ENCOUNTER — Other Ambulatory Visit: Payer: Self-pay | Admitting: Family Medicine

## 2023-07-06 DIAGNOSIS — J4520 Mild intermittent asthma, uncomplicated: Secondary | ICD-10-CM

## 2023-07-08 NOTE — Telephone Encounter (Signed)
Requested Prescriptions  Pending Prescriptions Disp Refills   albuterol (VENTOLIN HFA) 108 (90 Base) MCG/ACT inhaler [Pharmacy Med Name: ALBUTEROL HFA (PROAIR) INHALER] 8.5 each 2    Sig: TAKE 2 PUFFS BY MOUTH EVERY 6 HOURS AS NEEDED FOR WHEEZE OR SHORTNESS OF BREATH     Pulmonology:  Beta Agonists 2 Passed - 07/06/2023 11:05 AM      Passed - Last BP in normal range    BP Readings from Last 1 Encounters:  07/01/23 125/88         Passed - Last Heart Rate in normal range    Pulse Readings from Last 1 Encounters:  07/01/23 (!) 51         Passed - Valid encounter within last 12 months    Recent Outpatient Visits           1 week ago Annual physical exam   [facility] Merita Norton T, FNP   3 months ago Acute left-sided low back pain with left-sided sciatica   [facility] Merita Norton T, FNP   4 months ago Acute left-sided low back pain with left-sided sciatica   [facility] Jacky Kindle, FNP   1 year ago Annual physical exam   [facility] Jacky Kindle, FNP   1 year ago Herpes zoster without complication   [facility], Darl Householder, DO       Future Appointments             In 11 months Jacky Kindle, FNP [facility]

## 2023-07-16 ENCOUNTER — Other Ambulatory Visit: Payer: Self-pay | Admitting: Family Medicine

## 2023-07-16 DIAGNOSIS — M5442 Lumbago with sciatica, left side: Secondary | ICD-10-CM

## 2023-07-17 NOTE — Telephone Encounter (Signed)
Requested medication (s) are due for refill today: Yes  Requested medication (s) are on the active medication list: yes    Last refill: 03/19/23  #30  0 refills  Future visit scheduled yes  07/01/24  Notes to clinic:   Not delegated, please review. Thank you.  Requested Prescriptions  Pending Prescriptions Disp Refills   tiZANidine (ZANAFLEX) 4 MG tablet [Pharmacy Med Name: TIZANIDINE HCL 4 MG TABLET] 30 tablet 0    Sig: TAKE 1 TABLET BY MOUTH EVERY 6 HOURS AS NEEDED FOR MUSCLE SPASMS.     Not Delegated - Cardiovascular:  Alpha-2 Agonists - tizanidine Failed - 07/16/2023 12:18 PM      Failed - This refill cannot be delegated      Passed - Valid encounter within last 6 months    Recent Outpatient Visits           2 weeks ago Annual physical exam   [facility] Merita Norton T, FNP   4 months ago Acute left-sided low back pain with left-sided sciatica   [facility] Merita Norton T, FNP   4 months ago Acute left-sided low back pain with left-sided sciatica   [facility] Jacky Kindle, FNP   1 year ago Annual physical exam   [facility] Jacky Kindle, FNP   1 year ago Herpes zoster without complication   [facility] Caro Laroche, DO       Future Appointments             In 11 months Jacky Kindle, FNP [facility], [facility]

## 2023-08-19 ENCOUNTER — Other Ambulatory Visit: Payer: Self-pay | Admitting: Family Medicine

## 2023-08-19 DIAGNOSIS — M5412 Radiculopathy, cervical region: Secondary | ICD-10-CM

## 2023-08-29 ENCOUNTER — Ambulatory Visit
Admission: RE | Admit: 2023-08-29 | Discharge: 2023-08-29 | Disposition: A | Discharge status: Home / Self Care | Payer: Managed Care, Other (non HMO) | Source: Ambulatory Visit | Attending: Family Medicine | Admitting: Family Medicine

## 2023-08-29 DIAGNOSIS — M5412 Radiculopathy, cervical region: Secondary | ICD-10-CM

## 2023-09-19 NOTE — Progress Notes (Signed)
 Ellouise Console, PA-C [address]  [address]  [place], [place] [address]  Main: [phone]  Fax: [phone]   Gastroenterology Consultation  Referring Provider:     Emilio Kelly DASEN, FNP Primary Care Physician:  Emilio Kelly DASEN, FNP Primary Gastroenterologist:  Ellouise Console, PA-C / Dr. Corinn Brooklyn   Reason for Consultation:     Elevated LFTs        HPI:   Heather Curry is a 58 y.o. y/o female, estab. Pt. Of Dr. Brooklyn, is referred for consultation & management  by Emilio Kelly DASEN, FNP.  Established patient Dr. Brooklyn.  Referred to evaluate elevated LFTs.  She denies any GI symptoms such as abdominal pain, abdominal swelling, bowel irregularities.  She admits to weight gain.  06/2023 labs: Elevated alk phos 174, ALT 53.  Normal AST 21, total bilirubin 0.2.  Normal CBC, hemoglobin 13.3.  Normal TSH.  Total cholesterol 220, LDL 166, HDL 41, triglycerides 76.  Vitamin D  was very low.  She was recommended to take vitamin D  supplement.  She has not yet started vitamin D .  She has not had a liver ultrasound.  She denies any alcohol use.  She rarely has had a drink of alcohol in the past.  Her father had alcoholism.  No other family history of liver disease.  Patient does not take any OTC supplements.  She has had hepatitis B vaccine.  She has not had hepatitis A vaccine.  Has history of multiple adenomatous colon polyps and multiple colonoscopies.  Last colonoscopy 05/2022 showed 2 small adenomatous polyps removed.  5-year repeat (due 05/2027).  Past Medical History:  Diagnosis Date   Colon polyps    Hypertension     Past Surgical History:  Procedure Laterality Date   APPENDECTOMY     BREAST BIOPSY Right 2007   neg   BREAST BIOPSY Right 09/27/2020   affirm breast bx, x marker, BENIGN MAMMARY PARENCHYMA WITH DENSE STROMAL FIBROSIS,   BREAST SURGERY Left    Lumpectomy   CERVIX REMOVAL     COLONOSCOPY WITH PROPOFOL  N/A 04/18/2018   Procedure: COLONOSCOPY WITH PROPOFOL ;  Surgeon:  Janalyn Keene NOVAK, MD;  Location: [facility] ENDOSCOPY;  Service: Gastroenterology;  Laterality: N/A;   COLONOSCOPY WITH PROPOFOL  N/A 04/17/2019   Procedure: COLONOSCOPY WITH PROPOFOL ;  Surgeon: Janalyn Keene NOVAK, MD;  Location: [facility] ENDOSCOPY;  Service: Endoscopy;  Laterality: N/A;   COLONOSCOPY WITH PROPOFOL  N/A 06/01/2020   Procedure: COLONOSCOPY WITH PROPOFOL ;  Surgeon: Janalyn Keene NOVAK, MD;  Location: [facility] ENDOSCOPY;  Service: Endoscopy;  Laterality: N/A;   COLONOSCOPY WITH PROPOFOL  N/A 06/01/2022   Procedure: COLONOSCOPY WITH PROPOFOL ;  Surgeon: Brooklyn Corinn Skiff, MD;  Location: [facility] ENDOSCOPY;  Service: Gastroenterology;  Laterality: N/A;    Prior to Admission medications   Medication Sig Start Date End Date Taking? Authorizing Provider  albuterol  (VENTOLIN  HFA) 108 (90 Base) MCG/ACT inhaler TAKE 2 PUFFS BY MOUTH EVERY 6 HOURS AS NEEDED FOR WHEEZE OR SHORTNESS OF BREATH 07/08/23   Emilio Kelly DASEN, FNP  aspirin EC 81 MG tablet Take 81 mg by mouth daily. Swallow whole.    [provider]  gabapentin (NEURONTIN) 300 MG capsule Take 1 capsule by mouth 3 (three) times daily. 04/02/23 04/01/24  [provider]  hydrochlorothiazide  (HYDRODIURIL ) 25 MG tablet Take 1 tablet (25 mg total) by mouth daily. 07/01/23   Emilio Kelly DASEN, FNP  levothyroxine  (SYNTHROID ) 75 MCG tablet TAKE 1 TABLET BY MOUTH EVERY DAY 06/03/23   Emilio,  Kelly DASEN, FNP  meloxicam  (MOBIC ) 15 MG tablet TAKE 1 TABLET (15 MG TOTAL) BY MOUTH DAILY. 04/23/23   Emilio Kelly DASEN, FNP  montelukast  (SINGULAIR ) 10 MG tablet TAKE 1 TABLET BY MOUTH EVERYDAY AT BEDTIME 06/19/23   Emilio Kelly DASEN, FNP  PARoxetine  (PAXIL -CR) 12.5 MG 24 hr tablet Take 1 tablet (12.5 mg total) by mouth daily. 07/01/23   Emilio Kelly DASEN, FNP  tiZANidine  (ZANAFLEX ) 4 MG tablet TAKE 1 TABLET BY MOUTH EVERY 6 HOURS AS NEEDED FOR MUSCLE SPASMS. 07/18/23   Emilio Kelly DASEN, FNP    Family History  Problem Relation Age of Onset   Congestive Heart Failure  Mother    Hypertension Mother    Arthritis Mother    Alcohol abuse Father    Hypertension Father    COPD Father    Arthritis Maternal Grandmother    Cancer Maternal Grandmother    Hypertension Sister    Breast cancer Maternal Aunt 78     Social History   Tobacco Use   Smoking status: Former    Current packs/day: 0.00    Types: Cigarettes    Quit date: 2009    Years since quitting: 16.0   Smokeless tobacco: Never  Vaping Use   Vaping status: Never Used  Substance Use Topics   Alcohol use: Yes    Comment: rarely   Drug use: Never    Allergies as of 09/20/2023 - Review Complete 09/20/2023  Allergen Reaction Noted   Bee venom Anaphylaxis 02/14/2018   Penicillins  04/17/2019    Review of Systems:    All systems reviewed and negative except where noted in HPI.   Physical Exam:  BP 121/82   Pulse (!) 55   Temp 98.1 F (36.7 C)   Ht 5' 4 (1.626 m)   Wt 182 lb 3.2 oz (82.6 kg)   BMI 31.27 kg/m  No LMP recorded. Patient is postmenopausal.  General:   Alert,  Well-developed, well-nourished, pleasant and cooperative in NAD Lungs:  Respirations even and unlabored.  Clear throughout to auscultation.   No wheezes, crackles, or rhonchi. No acute distress. Heart:  Regular rate and rhythm; no murmurs, clicks, rubs, or gallops. Abdomen:  Normal bowel sounds.  No bruits.  Soft, and non-distended without masses, hepatosplenomegaly or hernias noted.  No Tenderness.  No guarding or rebound tenderness.    Neurologic:  Alert and oriented x3;  grossly normal neurologically. Psych:  Alert and cooperative. Normal mood and affect.  Imaging Studies: MR CERVICAL SPINE WO CONTRAST Result Date: 09/12/2023 CLINICAL DATA:  Cervical radiculopathy EXAM: MRI CERVICAL SPINE WITHOUT CONTRAST TECHNIQUE: Multiplanar, multisequence MR imaging of the cervical spine was performed. No intravenous contrast was administered. COMPARISON:  None Available. FINDINGS: Alignment: No significant listhesis.  Vertebrae: No acute fracture, evidence of discitis, or suspicious osseous lesion. Cord: Normal signal and morphology. Posterior Fossa, vertebral arteries, paraspinal tissues: Negative. Disc levels: C2-C3: No significant disc bulge. Left facet arthropathy. No spinal canal stenosis or neural foraminal narrowing. C3-C4: Mild disc bulge. Left facet and uncovertebral hypertrophy. No spinal canal stenosis. Mild left neural foraminal narrowing. C4-C5: Left foraminal protrusion. Left facet and uncovertebral hypertrophy. No spinal canal stenosis. Severe left neural foraminal narrowing. C5-C6: Disc height loss and disc osteophyte complex with central protrusion, which indents the ventral thecal sac and causes minimal spinal cord deformation. Facet and uncovertebral hypertrophy. Mild spinal canal stenosis. Mild right neural foraminal narrowing. C6-C7: No significant disc bulge. No spinal canal stenosis or neuroforaminal narrowing. C7-T1: No significant  disc bulge. No spinal canal stenosis or neuroforaminal narrowing. IMPRESSION: 1. C5-C6 mild spinal canal stenosis and mild right neural foraminal narrowing. 2. C4-C5 severe left neural foraminal narrowing. 3. C3-C4 mild left neural foraminal narrowing. Electronically Signed   By: Donald Campion M.D.   On: 09/12/2023 01:19    Assessment and Plan:   TYNLEE BAYLE is a 58 y.o. y/o female has been referred for:  1.  Elevated LFTs: Elevated alkaline phosphatase and ALT. Elevated alkaline phosphatase could be due to vitamin D  deficiency and should improve after vitamin D  deficiency has been treated.  Patient did not start vitamin D  supplement that was recommended.  I am also checking for all other liver diseases.  She does not drink alcohol.  Labs: Alkaline phosphatase isoenzymes, GGT, intact PTH and calcium, hepatic panel, ANA, AMA, ASMA, ceruloplasmin, viral hepatitis A/B/C, iron panel, ferritin, alpha-1 antitrypsin.  RUQ ultrasound  2.  Vitamin D  deficiency  Patient  was instructed to Start OTC vitamin D , 5000 IU daily.  Vitamin D  deficiency can lead to elevated alkaline phosphatase (ALP) levels. This is because vitamin D  plays a crucial role in calcium and phosphate regulation, and when deficient, it can trigger secondary hyperparathyroidism, which in turn can increase bone turnover and ALP levels.  Reference: NIH.gov [employer]).  3.  History of adenomatous colon polyps  5-year repeat colonoscopy will be due 05/2027.  Follow up in 2 months.  Ellouise Console, PA-C

## 2023-09-20 ENCOUNTER — Encounter: Payer: Self-pay | Admitting: Physician Assistant

## 2023-09-20 ENCOUNTER — Ambulatory Visit: Payer: Managed Care, Other (non HMO) | Admitting: Physician Assistant

## 2023-09-20 VITALS — BP 121/82 | HR 55 | Temp 98.1°F | Ht 64.0 in | Wt 182.2 lb

## 2023-09-20 DIAGNOSIS — R7989 Other specified abnormal findings of blood chemistry: Secondary | ICD-10-CM

## 2023-09-20 DIAGNOSIS — R748 Abnormal levels of other serum enzymes: Secondary | ICD-10-CM | POA: Diagnosis not present

## 2023-09-20 DIAGNOSIS — E559 Vitamin D deficiency, unspecified: Secondary | ICD-10-CM

## 2023-09-20 DIAGNOSIS — R7401 Elevation of levels of liver transaminase levels: Secondary | ICD-10-CM

## 2023-09-20 DIAGNOSIS — Z860101 Personal history of adenomatous and serrated colon polyps: Secondary | ICD-10-CM | POA: Diagnosis not present

## 2023-09-20 NOTE — Patient Instructions (Addendum)
 Ultrasound scheduled @ [facility] 09/24/23 arrive at 7:45 am. . Nothing to eat/drink after midnight.   Please start taking OTC vitamin D, 5000 IU every day to treat vitamin D deficiency.

## 2023-09-24 ENCOUNTER — Ambulatory Visit
Admission: RE | Admit: 2023-09-24 | Discharge: 2023-09-24 | Disposition: A | Discharge status: Home / Self Care | Payer: Managed Care, Other (non HMO) | Source: Ambulatory Visit | Attending: Physician Assistant | Admitting: Physician Assistant

## 2023-09-24 ENCOUNTER — Telehealth: Payer: Self-pay

## 2023-09-24 DIAGNOSIS — R7989 Other specified abnormal findings of blood chemistry: Secondary | ICD-10-CM | POA: Diagnosis present

## 2023-09-24 NOTE — Telephone Encounter (Signed)
 Left message for patient to return call to office.  Notify Pt. RUQ Ultrasound shows increased liver echogenicity, suggestive of mild fatty liver.  No gallstones.  Normal bile duct.  Nothing worrisome.  Recommend low-fat diet and regular exercise.  Continue with current plan.     1.  Calcium is elevated and PTH is elevated.  This is consistent with primary or secondary hyperparathyroidism which is most likely source of elevated alkaline phosphatase.  Most likely due to vitamin D  deficiency.  If these labs do not improve after vitamin D  deficiency has been corrected, then patient needs follow-up with endocrinologist.  Patient should also follow-up with PCP. 2.  Liver tests are the same, stable. 3.  GGT is Normal.  NO Liver source for elevated LFTs. 4.  ANA, AMA, ASMA are all normal.  No evidence of autoimmune hepatitis or primary biliary cirrhosis. 5.  HIV is Negative.  Viral hepatitis A, B, and C labs are all negative.  She is NOT immune to hepatitis A or B.  **Please offer Twinrix hepatitis A and B vaccine. 6.  Ceruloplasmin, alpha-1 antitrypsin, and iron studies are normal.  No evidence of Wilson's disease, antitrypsin deficiency, or hemochromatosis.  No evidence of liver disease.  Continue with Plan for RUQ Ultrasound as scheduled.

## 2023-09-24 NOTE — Telephone Encounter (Signed)
 Patient notified of all results- she had Ultrasound this morning- I will call her when we get the results.

## 2023-09-24 NOTE — Progress Notes (Signed)
 Notify Pt. RUQ Ultrasound shows increased liver echogenicity, suggestive of mild fatty liver.  No gallstones.  Normal bile duct.  Nothing worrisome.  Recommend low-fat diet and regular exercise.  Continue with current plan. Celso Amy, PA-C

## 2023-09-24 NOTE — Progress Notes (Signed)
 Notify pt. Labs show: 1.  Calcium is elevated and PTH is elevated.  This is consistent with primary or secondary hyperparathyroidism which is most likely source of elevated alkaline phosphatase.  Most likely due to vitamin D  deficiency.  If these labs do not improve after vitamin D  deficiency has been corrected, then patient needs follow-up with endocrinologist.  Patient should also follow-up with PCP. 2.  Liver tests are the same, stable. 3.  GGT is Normal.  NO Liver source for elevated LFTs. 4.  ANA, AMA, ASMA are all normal.  No evidence of autoimmune hepatitis or primary biliary cirrhosis. 5.  HIV is Negative.  Viral hepatitis A, B, and C labs are all negative.  She is NOT immune to hepatitis A or B.  **Please offer Twinrix hepatitis A and B vaccine. 6.  Ceruloplasmin, alpha-1 antitrypsin, and iron studies are normal.  No evidence of Wilson's disease, antitrypsin deficiency, or hemochromatosis.  No evidence of liver disease.  Continue with Plan for RUQ Ultrasound as scheduled. Ellouise Console, PA-C

## 2023-09-24 NOTE — Telephone Encounter (Signed)
 Left message for patient to return call to office.  [place], PA-C  [place], [place], [place] Cc: Emilio Kelly DASEN, FNP Notify pt. Labs show: 1.  Calcium is elevated and PTH is elevated.  This is consistent with primary or secondary hyperparathyroidism which is most likely source of elevated alkaline phosphatase.  Most likely due to vitamin D  deficiency.  If these labs do not improve after vitamin D  deficiency has been corrected, then patient needs follow-up with endocrinologist.  Patient should also follow-up with PCP. 2.  Liver tests are the same, stable. 3.  GGT is Normal.  NO Liver source for elevated LFTs. 4.  ANA, AMA, ASMA are all normal.  No evidence of autoimmune hepatitis or primary biliary cirrhosis. 5.  HIV is Negative.  Viral hepatitis A, B, and C labs are all negative.  She is NOT immune to hepatitis A or B.  **Please offer Twinrix hepatitis A and B vaccine. 6.  Ceruloplasmin, alpha-1 antitrypsin, and iron studies are normal.  No evidence of Wilson's disease, antitrypsin deficiency, or hemochromatosis.  No evidence of liver disease.  Continue with Plan for RUQ Ultrasound as scheduled. [place], PA-C

## 2023-10-04 LAB — PTH, INTACT AND CALCIUM
Calcium: 10.6 mg/dL — ABNORMAL HIGH (ref 8.7–10.2)
PTH: 70 pg/mL — ABNORMAL HIGH (ref 15–65)

## 2023-10-04 LAB — HEPATITIS B SURFACE ANTIGEN: Hepatitis B Surface Ag: NEGATIVE

## 2023-10-04 LAB — ANA: Anti Nuclear Antibody (ANA): NEGATIVE

## 2023-10-04 LAB — HEPATIC FUNCTION PANEL
ALT: 45 [IU]/L — ABNORMAL HIGH (ref 0–32)
AST: 25 [IU]/L (ref 0–40)
Albumin: 4.4 g/dL (ref 3.8–4.9)
Alkaline Phosphatase: 177 [IU]/L — ABNORMAL HIGH (ref 44–121)
Bilirubin Total: 0.3 mg/dL (ref 0.0–1.2)
Bilirubin, Direct: 0.1 mg/dL (ref 0.00–0.40)
Total Protein: 7 g/dL (ref 6.0–8.5)

## 2023-10-04 LAB — ALKALINE PHOSPHATASE, ISOENZYMES
BONE FRACTION: 53 % (ref 14–68)
INTESTINAL FRAC.: 1 % (ref 0–18)
LIVER FRACTION: 46 % (ref 18–85)

## 2023-10-04 LAB — HEPATITIS C ANTIBODY: Hep C Virus Ab: NONREACTIVE

## 2023-10-04 LAB — HIV ANTIBODY (ROUTINE TESTING W REFLEX): HIV Screen 4th Generation wRfx: NONREACTIVE

## 2023-10-04 LAB — HEPATITIS A ANTIBODY, TOTAL: hep A Total Ab: NEGATIVE

## 2023-10-04 LAB — IRON,TIBC AND FERRITIN PANEL
Ferritin: 45 ng/mL (ref 15–150)
Iron Saturation: 30 % (ref 15–55)
Iron: 109 ug/dL (ref 27–159)
Total Iron Binding Capacity: 364 ug/dL (ref 250–450)
UIBC: 255 ug/dL (ref 131–425)

## 2023-10-04 LAB — MITOCHONDRIAL/SMOOTH MUSCLE AB PNL
Mitochondrial Ab: <20 U (ref 0.0–20.0)
Smooth Muscle Ab: 5 U (ref 0–19)

## 2023-10-04 LAB — ALPHA-1-ANTITRYPSIN: A-1 Antitrypsin: 148 mg/dL (ref 101–187)

## 2023-10-04 LAB — HEPATITIS B CORE ANTIBODY, TOTAL: Hep B Core Total Ab: NEGATIVE

## 2023-10-04 LAB — GAMMA GT: GGT: 41 [IU]/L (ref 0–60)

## 2023-10-04 LAB — CERULOPLASMIN: Ceruloplasmin: 29.4 mg/dL (ref 19.0–39.0)

## 2023-10-04 LAB — HEPATITIS B SURFACE ANTIBODY,QUALITATIVE: Hep B Surface Ab, Qual: NONREACTIVE

## 2023-11-12 NOTE — Progress Notes (Unsigned)
 Celso Amy, PA-C [address]  [address]  [place], [place] [address]  Main: [phone]  Fax: [phone]   Primary Care Physician: Jacky Kindle, FNP  Primary Gastroenterologist:  Celso Amy, PA-C / Dr. Lannette Donath    CC: F/U Elevated LFTs  HPI: Heather Curry is a 58 y.o. female returns for follow-up of elevated LFTs.  Patient denies any GI symptoms such as abdominal pain.  No alcohol use.  She is feeling well today with no GI concerns.  06/2023 labs: Elevated alk phos 174, ALT 53.  Normal AST 21, total bilirubin 0.2.  Normal CBC, hemoglobin 13.3.  Normal TSH.  Total cholesterol 220, LDL 166, HDL 41, triglycerides 76.  Vitamin D was very low.  She is currently taking OTC vitamin D daily.  09/20/2023 labs: Elevated calcium and PTH.  Stable mild elevated alkaline phosphatase 177 and ALT 45.  Normal AST 25 and bilirubin 0.3.  Normal GGT.  Negative ANA, AMA, ASMA.  Negative HIV.  Viral hepatitis A, B, C labs negative.  Not immune to hepatitis A or B.  Ceruloplasmin, alpha-1 antitrypsin, and iron studies normal.  09/24/2023 RUQ ultrasound: Increased hepatic echogenicity consistent with hepatic steatosis.  No cholelithiasis or liver lesions.  CBD 3 mm.  She has history of multiple adenomatous colon polyps and multiple colonoscopies.  Last colonoscopy 05/2022 showed 2 small adenomatous polyps removed.  5-year repeat (due 05/2027).   Current Outpatient Medications  Medication Sig Dispense Refill   albuterol (VENTOLIN HFA) 108 (90 Base) MCG/ACT inhaler TAKE 2 PUFFS BY MOUTH EVERY 6 HOURS AS NEEDED FOR WHEEZE OR SHORTNESS OF BREATH 8.5 each 2   aspirin EC 81 MG tablet Take 81 mg by mouth daily. Swallow whole.     gabapentin (NEURONTIN) 300 MG capsule Take 1 capsule by mouth 3 (three) times daily.     hydrochlorothiazide (HYDRODIURIL) 25 MG tablet Take 1 tablet (25 mg total) by mouth daily. 90 tablet 3   levothyroxine (SYNTHROID) 75 MCG tablet TAKE 1 TABLET BY MOUTH EVERY DAY 90  tablet 0   meloxicam (MOBIC) 15 MG tablet TAKE 1 TABLET (15 MG TOTAL) BY MOUTH DAILY. 30 tablet 0   montelukast (SINGULAIR) 10 MG tablet TAKE 1 TABLET BY MOUTH EVERYDAY AT BEDTIME 90 tablet 2   PARoxetine (PAXIL-CR) 12.5 MG 24 hr tablet Take 1 tablet (12.5 mg total) by mouth daily. 90 tablet 3   tiZANidine (ZANAFLEX) 4 MG tablet TAKE 1 TABLET BY MOUTH EVERY 6 HOURS AS NEEDED FOR MUSCLE SPASMS. 30 tablet 0   No current facility-administered medications for this visit.    Allergies as of 11/13/2023 - Review Complete 09/20/2023  Allergen Reaction Noted   Bee venom Anaphylaxis 02/14/2018   Penicillins  04/17/2019    Past Medical History:  Diagnosis Date   Colon polyps    Hypertension     Past Surgical History:  Procedure Laterality Date   APPENDECTOMY     BREAST BIOPSY Right 2007   neg   BREAST BIOPSY Right 09/27/2020   affirm breast bx, x marker, BENIGN MAMMARY PARENCHYMA WITH DENSE STROMAL FIBROSIS,   BREAST SURGERY Left    Lumpectomy   CERVIX REMOVAL     COLONOSCOPY WITH PROPOFOL N/A 04/18/2018   Procedure: COLONOSCOPY WITH PROPOFOL;  Surgeon: Pasty Spillers, MD;  Location: [facility] ENDOSCOPY;  Service: Gastroenterology;  Laterality: N/A;   COLONOSCOPY WITH PROPOFOL N/A 04/17/2019   Procedure: COLONOSCOPY WITH PROPOFOL;  Surgeon: Pasty Spillers, MD;  Location: [facility]  ENDOSCOPY;  Service: Endoscopy;  Laterality: N/A;   COLONOSCOPY WITH PROPOFOL N/A 06/01/2020   Procedure: COLONOSCOPY WITH PROPOFOL;  Surgeon: Pasty Spillers, MD;  Location: [facility] ENDOSCOPY;  Service: Endoscopy;  Laterality: N/A;   COLONOSCOPY WITH PROPOFOL N/A 06/01/2022   Procedure: COLONOSCOPY WITH PROPOFOL;  Surgeon: Toney Reil, MD;  Location: [facility] ENDOSCOPY;  Service: Gastroenterology;  Laterality: N/A;    Review of Systems:    All systems reviewed and negative except where noted in HPI.   Physical Examination:   There were no vitals taken for this visit.  General: Well-nourished,  well-developed in no acute distress.  Neuro: Alert and oriented x 3.  Grossly intact.  Psych: Alert and cooperative, normal mood and affect.   Imaging Studies: No results found.  Assessment and Plan:   Heather Curry is a 58 y.o. y/o female returns for follow-up of mildly elevated alkaline phosphatase and ALT liver tests.  RUQ ultrasound showed mild hepatic steatosis.  GGT normal.  Low vitamin D, mild elevated calcium and PTH.  Suspect mild elevated LFTs due to hepatic steatosis and hyperparathyroidism secondary to vitamin D deficiency.  No alcohol use.  Labs negative for autoimmune hepatitis, PBC, Wilson's disease, hemochromatosis, alpha-1 antitrypsin deficiency, viral hepatitis.  1.  Mild elevated LFTs (alk phos and ALT)  Give Twinrix hepatitis A and B vaccine  Lab: Repeat hepatic panel.  2.  Hepatic steatosis  Recommend a low-fat diet, regular exercise, and weight loss. Patient education handout about fatty liver disease was given and discussed from up-to-date.    Fibrosis 4 Score = .74 (Low risk)        Interpretation for patients with NAFLD          <1.30       -  F0-F1 (Low risk)          1.30-2.67 -  Indeterminate           >2.67      -  F3-F4 (High risk)     Validated for ages 22-65  3.  Vitamin D deficiency  Repeat vitamin D lab  Continue OTC vitamin D supplement daily. Adjust dose based on lab result.  4.  History of adenomatous colon polyps   5-year repeat colonoscopy will be due 05/2027.   Celso Amy, PA-C  Follow up based on lab results and GI symptoms.

## 2023-11-13 ENCOUNTER — Ambulatory Visit: Payer: Managed Care, Other (non HMO) | Admitting: Physician Assistant

## 2023-11-13 ENCOUNTER — Encounter: Payer: Self-pay | Admitting: Physician Assistant

## 2023-11-13 VITALS — BP 140/92 | HR 61 | Temp 98.6°F | Wt 179.0 lb

## 2023-11-13 DIAGNOSIS — R748 Abnormal levels of other serum enzymes: Secondary | ICD-10-CM | POA: Diagnosis not present

## 2023-11-13 DIAGNOSIS — R7989 Other specified abnormal findings of blood chemistry: Secondary | ICD-10-CM

## 2023-11-13 DIAGNOSIS — E213 Hyperparathyroidism, unspecified: Secondary | ICD-10-CM

## 2023-11-13 DIAGNOSIS — R7401 Elevation of levels of liver transaminase levels: Secondary | ICD-10-CM

## 2023-11-13 DIAGNOSIS — E559 Vitamin D deficiency, unspecified: Secondary | ICD-10-CM | POA: Diagnosis not present

## 2023-11-13 DIAGNOSIS — K760 Fatty (change of) liver, not elsewhere classified: Secondary | ICD-10-CM

## 2023-11-13 DIAGNOSIS — Z860101 Personal history of adenomatous and serrated colon polyps: Secondary | ICD-10-CM

## 2024-02-17 LAB — HM MAMMOGRAPHY

## 2024-04-07 ENCOUNTER — Telehealth: Payer: Self-pay | Admitting: Family Medicine

## 2024-04-07 ENCOUNTER — Other Ambulatory Visit: Payer: Self-pay

## 2024-04-07 DIAGNOSIS — J4520 Mild intermittent asthma, uncomplicated: Secondary | ICD-10-CM

## 2024-04-07 MED ORDER — MONTELUKAST SODIUM 10 MG PO TABS
10.0000 mg | ORAL_TABLET | Freq: Every day | ORAL | 1 refills | Status: AC
Start: 2024-04-07 — End: ?

## 2024-04-07 NOTE — Telephone Encounter (Signed)
[facility] pharmacy faxed refill request for the following medications:  montelukast (SINGULAIR) 10 MG tablet   Please advise

## 2024-04-07 NOTE — Telephone Encounter (Signed)
 Medication sent. Message sent to patient courtesy refill.

## 2024-04-14 ENCOUNTER — Encounter: Payer: Self-pay | Admitting: Pain Medicine

## 2024-04-14 ENCOUNTER — Ambulatory Visit: Attending: Pain Medicine | Admitting: Pain Medicine

## 2024-04-14 VITALS — BP 130/86 | HR 60 | Temp 98.2°F | Resp 16 | Ht 64.0 in | Wt 178.0 lb

## 2024-04-14 DIAGNOSIS — M545 Low back pain, unspecified: Secondary | ICD-10-CM | POA: Diagnosis present

## 2024-04-14 DIAGNOSIS — E559 Vitamin D deficiency, unspecified: Secondary | ICD-10-CM | POA: Insufficient documentation

## 2024-04-14 DIAGNOSIS — R937 Abnormal findings on diagnostic imaging of other parts of musculoskeletal system: Secondary | ICD-10-CM | POA: Diagnosis present

## 2024-04-14 DIAGNOSIS — M5412 Radiculopathy, cervical region: Secondary | ICD-10-CM | POA: Diagnosis present

## 2024-04-14 DIAGNOSIS — M5417 Radiculopathy, lumbosacral region: Secondary | ICD-10-CM | POA: Insufficient documentation

## 2024-04-14 DIAGNOSIS — M5416 Radiculopathy, lumbar region: Secondary | ICD-10-CM | POA: Insufficient documentation

## 2024-04-14 DIAGNOSIS — M25511 Pain in right shoulder: Secondary | ICD-10-CM | POA: Diagnosis not present

## 2024-04-14 DIAGNOSIS — M25552 Pain in left hip: Secondary | ICD-10-CM | POA: Diagnosis present

## 2024-04-14 DIAGNOSIS — M5013 Cervical disc disorder with radiculopathy, cervicothoracic region: Secondary | ICD-10-CM | POA: Diagnosis present

## 2024-04-14 DIAGNOSIS — M899 Disorder of bone, unspecified: Secondary | ICD-10-CM | POA: Insufficient documentation

## 2024-04-14 DIAGNOSIS — M79605 Pain in left leg: Secondary | ICD-10-CM | POA: Diagnosis present

## 2024-04-14 DIAGNOSIS — Z789 Other specified health status: Secondary | ICD-10-CM | POA: Insufficient documentation

## 2024-04-14 DIAGNOSIS — M4802 Spinal stenosis, cervical region: Secondary | ICD-10-CM | POA: Diagnosis present

## 2024-04-14 DIAGNOSIS — M533 Sacrococcygeal disorders, not elsewhere classified: Secondary | ICD-10-CM | POA: Insufficient documentation

## 2024-04-14 DIAGNOSIS — M47816 Spondylosis without myelopathy or radiculopathy, lumbar region: Secondary | ICD-10-CM | POA: Insufficient documentation

## 2024-04-14 DIAGNOSIS — R748 Abnormal levels of other serum enzymes: Secondary | ICD-10-CM | POA: Diagnosis present

## 2024-04-14 DIAGNOSIS — M79601 Pain in right arm: Secondary | ICD-10-CM | POA: Insufficient documentation

## 2024-04-14 DIAGNOSIS — Z79899 Other long term (current) drug therapy: Secondary | ICD-10-CM | POA: Insufficient documentation

## 2024-04-14 DIAGNOSIS — M5126 Other intervertebral disc displacement, lumbar region: Secondary | ICD-10-CM | POA: Insufficient documentation

## 2024-04-14 DIAGNOSIS — M4185 Other forms of scoliosis, thoracolumbar region: Secondary | ICD-10-CM | POA: Diagnosis present

## 2024-04-14 DIAGNOSIS — M546 Pain in thoracic spine: Secondary | ICD-10-CM | POA: Insufficient documentation

## 2024-04-14 DIAGNOSIS — M5441 Lumbago with sciatica, right side: Secondary | ICD-10-CM | POA: Diagnosis present

## 2024-04-14 DIAGNOSIS — M5459 Other low back pain: Secondary | ICD-10-CM | POA: Diagnosis present

## 2024-04-14 DIAGNOSIS — M5410 Radiculopathy, site unspecified: Secondary | ICD-10-CM | POA: Insufficient documentation

## 2024-04-14 DIAGNOSIS — M79604 Pain in right leg: Secondary | ICD-10-CM | POA: Diagnosis present

## 2024-04-14 DIAGNOSIS — M25551 Pain in right hip: Secondary | ICD-10-CM | POA: Insufficient documentation

## 2024-04-14 DIAGNOSIS — M5030 Other cervical disc degeneration, unspecified cervical region: Secondary | ICD-10-CM | POA: Diagnosis present

## 2024-04-14 DIAGNOSIS — M5442 Lumbago with sciatica, left side: Secondary | ICD-10-CM | POA: Diagnosis present

## 2024-04-14 DIAGNOSIS — M25512 Pain in left shoulder: Secondary | ICD-10-CM | POA: Insufficient documentation

## 2024-04-14 DIAGNOSIS — M48062 Spinal stenosis, lumbar region with neurogenic claudication: Secondary | ICD-10-CM | POA: Insufficient documentation

## 2024-04-14 DIAGNOSIS — M51361 Other intervertebral disc degeneration, lumbar region with lower extremity pain only: Secondary | ICD-10-CM | POA: Insufficient documentation

## 2024-04-14 DIAGNOSIS — M542 Cervicalgia: Secondary | ICD-10-CM | POA: Insufficient documentation

## 2024-04-14 DIAGNOSIS — G8929 Other chronic pain: Secondary | ICD-10-CM | POA: Insufficient documentation

## 2024-04-14 DIAGNOSIS — R001 Bradycardia, unspecified: Secondary | ICD-10-CM | POA: Insufficient documentation

## 2024-04-14 DIAGNOSIS — M48061 Spinal stenosis, lumbar region without neurogenic claudication: Secondary | ICD-10-CM | POA: Diagnosis present

## 2024-04-14 DIAGNOSIS — M47812 Spondylosis without myelopathy or radiculopathy, cervical region: Secondary | ICD-10-CM | POA: Diagnosis present

## 2024-04-14 DIAGNOSIS — J45909 Unspecified asthma, uncomplicated: Secondary | ICD-10-CM | POA: Insufficient documentation

## 2024-04-14 DIAGNOSIS — G894 Chronic pain syndrome: Secondary | ICD-10-CM | POA: Insufficient documentation

## 2024-04-14 DIAGNOSIS — M549 Dorsalgia, unspecified: Secondary | ICD-10-CM | POA: Insufficient documentation

## 2024-04-14 DIAGNOSIS — Z0289 Encounter for other administrative examinations: Secondary | ICD-10-CM | POA: Insufficient documentation

## 2024-04-14 DIAGNOSIS — R079 Chest pain, unspecified: Secondary | ICD-10-CM | POA: Insufficient documentation

## 2024-04-14 MED ORDER — PREDNISONE 20 MG PO TABS
ORAL_TABLET | ORAL | 0 refills | Status: AC
Start: 2024-04-14 — End: 2024-04-23

## 2024-04-14 NOTE — Progress Notes (Signed)
 Safety precautions to be maintained throughout the outpatient stay will include: orient to surroundings, keep bed in low position, maintain call bell within reach at all times, provide assistance with transfer out of bed and ambulation.

## 2024-04-14 NOTE — Progress Notes (Signed)
 PROVIDER NOTE: Interpretation of information contained herein should be left to medically-trained personnel. Specific patient instructions are provided elsewhere under Patient Instructions section of medical record. This document was created in part using AI and STT-dictation technology, any transcriptional errors that may result from this process are unintentional.  Patient: Heather Curry  Service: E/M Encounter  Provider: Eric DELENA Como, MD  DOB: 14-Oct-1965  Delivery: Face-to-face  Specialty: Interventional Pain Management  MRN: [id number]  Setting: Ambulatory outpatient facility  Specialty designation: 09  Type: New Patient  Location: Outpatient office facility  PCP: Emilio Kelly DASEN, FNP (Inactive)  DOS: 04/14/2024    Referring Prov.: Shaner, Redell Dawn, MD   Primary Reason(s) for Visit: Encounter for initial evaluation of one or more chronic problems (new to examiner) potentially causing chronic pain, and posing a threat to normal musculoskeletal function. (Level of risk: High) CC: Neck Pain, Shoulder Pain (R>L), Back Pain (Entire back bilateral ), Hip Pain (Bilateral ), Leg Pain (Bilateral ), and Foot Pain (Bilateral )  HPI  Heather Curry is a 58 y.o. year old, female patient, who comes for the first time to our practice referred by Lenny Redell Dawn, MD for our initial evaluation of her chronic pain. 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, L3-4); Impaired glucose metabolism; Screening mammogram for breast cancer; Asthma; Asymptomatic bradycardia; Chest  pain, unspecified; Encounter for other administrative examinations; Chronic pain syndrome; Pharmacologic therapy; Disorder of skeletal system; Problems influencing health status; Abnormal MRI, lumbar spine (05/04/2023); Abnormal MRI, cervical spine (09/12/2023); Cervicalgia; Chronic neck and back pain (1ry area of Pain); Chronic shoulder pain (2ry area of Pain) (Bilateral); Chronic upper extremity pain (3ry area of Pain) (Right); Cervical radiculopathy at C7 (Right); Cervical radiculopathy at C8 (Right); Cervical radiculopathy (Right); Cervical disc disorder with radiculopathy, cervicothoracic region; Radicular pain of shoulder (Bilateral); Chronic low back pain (4th area of Pain) (Bilateral) w/ sciatica (Bilateral); Chronic low back pain (Bilateral) w/o sciatica; Chronic hip pain (Bilateral); Chronic lower extremity pain (5th area of Pain) (Bilateral); Chronic lower extremity radicular pain (Bilateral); Lumbar facet joint pain; Cervical facet joint pain; Lumbar radiculitis (Bilateral); Lumbosacral radiculopathy at L5 (Bilateral); Lumbosacral radiculopathy at S1 (Bilateral); Chronic upper back pain; Chronic thoracic back pain (Bilateral); Cervical facet arthropathy; Cervical facet joint syndrome; DDD (degenerative disc disease), cervical; 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 Today she comes in for evaluation of her Neck Pain, Shoulder Pain (R>L), Back  Pain (Entire back bilateral ), Hip Pain (Bilateral ), Leg Pain (Bilateral ), and Foot Pain (Bilateral )  Pain Assessment: Location: Upper, Mid, Lower, Right, Left Back Radiating: From upper back into shoulders bilateral down (R>L) down the right arm. From upper back down the entie back into lower back bilateral to hips bilateral to legs bilateral to feet  bilateral Onset: More than a month ago Duration: Chronic pain Quality: Burning, Constant, Heaviness, Tingling, Penetrating, Radiating, Aching Severity: 8 /10 (subjective, self-reported pain score)  Effect on ADL: Limits ADLs. Hard to walk long distances and pain worsens throughout the day Timing: Constant Modifying factors: Laying flat and using relaxation techniques ease pain BP: 130/86  HR: 60  Onset and Duration: Gradual, Date of onset: 53, and Present longer than 3 months Cause of pain: Work related accident or event Therapist, nutritional Severity: Getting worse, NAS-11 at its worse: 10/10, NAS-11 at its best: 6/10, NAS-11 now: 8/10, and NAS-11 on the average: 8/10 Timing: Not influenced by the time of the day Aggravating Factors: Bending, Kneeling, Lifiting, Motion, Prolonged sitting, Prolonged standing, Squatting, Stooping , Twisting, Walking, and Working Alleviating Factors: Lying down Associated Problems: Night-time cramps, Depression, Fatigue, Nausea, Numbness, Sadness, Sweating, Tingling, Weakness, and Pain that does not allow patient to sleep Quality of Pain: Agonizing, Burning, Constant, Cruel, Deep, Disabling, Exhausting, Feeling of weight, Getting longer, Getting shorter, Heavy, Pulsating, Sickening, Tingling, and Work related Previous Examinations or Tests: CT scan, X-rays, Orthopedic evaluation, Chiropractic evaluation, and Psychiatric evaluation Previous Treatments: Chiropractic manipulations, Epidural steroid injections, Physical Therapy, Relaxation therapy, Steroid treatments by mouth, and Stretching exercises  Heather Curry is being  evaluated for possible interventional pain management therapies for the treatment of her chronic pain.  Discussed the use of AI scribe software for clinical note transcription with the patient, who gave verbal consent to proceed.  History of Present Illness   Heather Curry is a 58 year old female with chronic back pain who presents with continuous back pain and decreased functionality.  She experiences continuous back pain that has worsened over the last few years, particularly since her job became more sedentary due to COVID-19. The pain originates from an old injury and is present all day, every day, leading to decreased functionality.  The pain radiates from her upper spine into her right arm, causing her to brace it to use a mouse. She also experiences pain between her shoulder blades, radiating down to her lower back, hips, and the back of her thighs, with tingling sensations extending to the tops of her feet. Her legs feel unfamiliar, and she experiences tingling.  Her job is currently sedentary, requiring her to sit at a computer for extended periods.        Heather Curry has been informed that this initial visit was an evaluation only.  On the follow up appointment I will go over the results, including ordered tests and available interventional therapies. At that time she will have the opportunity to decide whether to proceed with offered therapies or not. In the event that Heather Curry prefers avoiding interventional options, this will conclude our involvement in the case.  Medication management recommendations may be provided upon request.  Patient informed that diagnostic tests may be ordered to assist in identifying underlying causes, narrow the list of differential diagnoses and aid in determining candidacy for (or contraindications to) planned therapeutic interventions.  Historic Controlled Substance Pharmacotherapy Review PMP and historical list of controlled substances:  Gabapentin 300 mg capsule, 1 cap p.o. 3 times daily (90/month) (# 90) (last filled on 11/08/2023); diazepam 2 mg tablet (# 2) (last filled on 09/26/2023); tramadol  50 mg tablet, 1 tab p.o. 5 times per day (# 15) (last filled on 04/15/2022) Most recently prescribed controlled substance(s): Opioid Analgesic: None MME/day: 0 mg/day  Historical Monitoring: The patient  reports no history of drug use.  List of prior UDS Testing: No results found for: MDMA, COCAINSCRNUR, PCPSCRNUR, PCPQUANT, CANNABQUANT, THCU, ETH, CBDTHCR, D8THCCBX, D9THCCBX Historical Background Evaluation: [place] PMP: PDMP reviewed during this encounter. Review of the past 87-months conducted.             PMP NARX Score Report:  Narcotic: 040 Sedative: 040 Stimulant: 000 [place] Department of public safety, offender search: Engineer, mining Information) Non-contributory Risk Assessment Profile: Aberrant behavior: None observed or detected today Risk factors for fatal opioid overdose: None identified today PMP NARX Overdose Risk Score: 260 Fatal overdose hazard ratio (HR): Calculation deferred Non-fatal overdose hazard ratio (HR): Calculation deferred Risk of opioid abuse or dependence: 0.7-3.0% with doses <= 36 MME/day and 6.1-26% with doses >= 120 MME/day. Substance use disorder (SUD) risk level: See below Personal History of Substance Abuse (SUD-Substance use disorder):  Alcohol: Negative  Illegal Drugs: Positive Female or Female (20-30 years ago)  Rx Drugs: Negative  ORT Risk Level calculation: Moderate Risk  Opioid Risk Tool - 04/14/24 1326       Family History of Substance Abuse   Alcohol Positive Female   Father   Illegal Drugs Negative    Rx Drugs Negative      Personal History of Substance Abuse   Alcohol Negative    Illegal Drugs Positive Female or Female   20-30 years ago   Rx Drugs Negative      Age   Age between 16-45 years  No      History of Preadolescent Sexual Abuse   History of Preadolescent  Sexual Abuse Negative or Female      Psychological Disease   Psychological Disease Negative    Depression Positive      Total Score   Opioid Risk Tool Scoring 6    Opioid Risk Interpretation Moderate Risk         ORT Scoring interpretation table:  Score <3 = Low Risk for SUD  Score between 4-7 = Moderate Risk for SUD  Score >8 = High Risk for Opioid Abuse   PHQ-2 Depression Scale:  Total score: 1  PHQ-2 Scoring interpretation table: (Score and probability of major depressive disorder)  Score 0 = No depression  Score 1 = 15.4% Probability  Score 2 = 21.1% Probability  Score 3 = 38.4% Probability  Score 4 = 45.5% Probability  Score 5 = 56.4% Probability  Score 6 = 78.6% Probability   PHQ-9 Depression Scale:  Total score: 1  PHQ-9 Scoring interpretation table:  Score 0-4 = No depression  Score 5-9 = Mild depression  Score 10-14 = Moderate depression  Score 15-19 = Moderately severe depression  Score 20-27 = Severe depression (2.4 times higher risk of SUD and 2.89 times higher risk of overuse)   Pharmacologic Plan: As per protocol, I have not taken over any controlled substance management, pending the results of ordered tests and/or consults.            Initial impression: Pending review of available data and ordered tests.  Meds   Current Outpatient Medications:    aspirin EC 81 MG tablet, Take 81 mg by mouth daily. Swallow whole., Disp: , Rfl:    budesonide-formoterol (SYMBICORT) 160-4.5 MCG/ACT inhaler, Inhale into the lungs., Disp: , Rfl:    buPROPion (WELLBUTRIN XL) 150 MG 24 hr tablet, Take 150 mg by mouth daily., Disp: , Rfl:    diclofenac Sodium (VOLTAREN) 1 % GEL, Apply topically., Disp: , Rfl:    gabapentin (NEURONTIN) 300 MG capsule, Take 1 capsule  by mouth 4 (four) times daily., Disp: , Rfl:    hydrochlorothiazide  (HYDRODIURIL ) 25 MG tablet, Take 1 tablet (25 mg total) by mouth daily., Disp: 90 tablet, Rfl: 3   levothyroxine  (SYNTHROID ) 75 MCG tablet, TAKE 1  TABLET BY MOUTH EVERY DAY, Disp: 90 tablet, Rfl: 0   meloxicam  (MOBIC ) 15 MG tablet, TAKE 1 TABLET (15 MG TOTAL) BY MOUTH DAILY., Disp: 30 tablet, Rfl: 0   montelukast  (SINGULAIR ) 10 MG tablet, Take 1 tablet (10 mg total) by mouth at bedtime., Disp: 90 tablet, Rfl: 1   predniSONE  (DELTASONE ) 20 MG tablet, Take 3 tablets (60 mg total) by mouth daily with breakfast for 3 days, THEN 2 tablets (40 mg total) daily with breakfast for 3 days, THEN 1 tablet (20 mg total) daily with breakfast for 3 days., Disp: 18 tablet, Rfl: 0   rosuvastatin (CRESTOR) 20 MG tablet, Take 20 mg by mouth., Disp: , Rfl:    tiZANidine  (ZANAFLEX ) 4 MG tablet, TAKE 1 TABLET BY MOUTH EVERY 6 HOURS AS NEEDED FOR MUSCLE SPASMS., Disp: 30 tablet, Rfl: 0  Imaging Review  Cervical Imaging: Cervical MR wo contrast: Results for orders placed during the hospital encounter of 08/29/23 MR CERVICAL SPINE WO CONTRAST  Narrative CLINICAL DATA:  Cervical radiculopathy  EXAM: MRI CERVICAL SPINE WITHOUT CONTRAST  TECHNIQUE: Multiplanar, multisequence MR imaging of the cervical spine was performed. No intravenous contrast was administered.  COMPARISON:  None Available.  FINDINGS: Alignment: No significant listhesis.  Vertebrae: No acute fracture, evidence of discitis, or suspicious osseous lesion.  Cord: Normal signal and morphology.  Posterior Fossa, vertebral arteries, paraspinal tissues: Negative.  Disc levels:  C2-C3: No significant disc bulge. Left facet arthropathy. No spinal canal stenosis or neural foraminal narrowing.  C3-C4: Mild disc bulge. Left facet and uncovertebral hypertrophy. No spinal canal stenosis. Mild left neural foraminal narrowing.  C4-C5: Left foraminal protrusion. Left facet and uncovertebral hypertrophy. No spinal canal stenosis. Severe left neural foraminal narrowing.  C5-C6: Disc height loss and disc osteophyte complex with central protrusion, which indents the ventral thecal sac and  causes minimal spinal cord deformation. Facet and uncovertebral hypertrophy. Mild spinal canal stenosis. Mild right neural foraminal narrowing.  C6-C7: No significant disc bulge. No spinal canal stenosis or neuroforaminal narrowing.  C7-T1: No significant disc bulge. No spinal canal stenosis or neuroforaminal narrowing.  IMPRESSION: 1. C5-C6 mild spinal canal stenosis and mild right neural foraminal narrowing. 2. C4-C5 severe left neural foraminal narrowing. 3. C3-C4 mild left neural foraminal narrowing.   Electronically Signed By: Donald Campion M.D. On: 09/12/2023 01:19  Lumbosacral Imaging: Lumbar MR wo contrast: Results for orders placed during the hospital encounter of 05/04/23 MR LUMBAR SPINE WO CONTRAST  Narrative CLINICAL DATA:  Radiating pain from the back down both legs to the feet.  EXAM: MRI LUMBAR SPINE WITHOUT CONTRAST  TECHNIQUE: Multiplanar, multisequence MR imaging of the lumbar spine was performed. No intravenous contrast was administered.  COMPARISON:  None Available.  FINDINGS: Segmentation:  Standard.  Alignment:  Physiologic.  Levocurvature of the thoracolumbar spine.  Vertebrae: No acute fracture, evidence of discitis, or aggressive bone lesion.  Conus medullaris and cauda equina: Conus extends to the L1-2 level. Conus and cauda equina appear normal.  Paraspinal and other soft tissues: No acute paraspinal abnormality.  Disc levels:  Disc spaces: Degenerative disease with disc height loss at T11-12, T12-L1. Disc desiccation throughout the lumbar spine.  T12-L1: Small right paracentral disc protrusion. Mild bilateral facet arthropathy. No foraminal or central canal stenosis.  L1-L2: Mild broad-based disc bulge. Mild bilateral facet arthropathy. No foraminal or central canal stenosis.  L2-L3: Mild broad-based disc bulge with a broad left foraminal/lateral disc protrusion. Moderate bilateral facet arthropathy. Mild spinal stenosis.  Bilateral lateral recess stenosis. No foraminal stenosis.  L3-L4: Mild broad-based disc bulge. Moderate bilateral facet arthropathy. Mild spinal stenosis. Bilateral lateral recess stenosis. No foraminal stenosis.  L4-L5: Mild broad-based disc bulge. Moderate bilateral facet arthropathy. No spinal stenosis. No foraminal stenosis.  L5-S1: Mild broad-based disc bulge. Mild bilateral facet arthropathy. No foraminal or central canal stenosis.  IMPRESSION: 1. Lumbar spine spondylosis as described above. 2. No acute osseous injury of the lumbar spine.   Electronically Signed By: Julaine Blanch M.D. On: 05/04/2023 16:39  Complexity Note: Imaging results reviewed.                         ROS  Cardiovascular: Daily Aspirin intake, Heart murmur, and Needs antibiotics prior to dental procedures Pulmonary or Respiratory: Wheezing and difficulty taking a deep full breath (Asthma), Shortness of breath, Smoking, and Snoring  Neurological: No reported neurological signs or symptoms such as seizures, abnormal skin sensations, urinary and/or fecal incontinence, being born with an abnormal open spine and/or a tethered spinal cord Psychological-Psychiatric: Depressed, Prone to panicking, and Suicidal ideations Gastrointestinal: No reported gastrointestinal signs or symptoms such as vomiting or evacuating blood, reflux, heartburn, alternating episodes of diarrhea and constipation, inflamed or scarred liver, or pancreas or irrregular and/or infrequent bowel movements Genitourinary: No reported renal or genitourinary signs or symptoms such as difficulty voiding or producing urine, peeing blood, non-functioning kidney, kidney stones, difficulty emptying the bladder, difficulty controlling the flow of urine, or chronic kidney disease Hematological: Brusing easily Endocrine: Slow thyroid Rheumatologic: No reported rheumatological signs and symptoms such as fatigue, joint pain, tenderness, swelling, redness,  heat, stiffness, decreased range of motion, with or without associated rash Musculoskeletal: Negative for myasthenia gravis, muscular dystrophy, multiple sclerosis or malignant hyperthermia Work History: Working full time  Allergies  Heather Curry is allergic to bee venom and penicillins.  Laboratory Chemistry Profile   Renal Lab Results  Component Value Date   BUN 20 07/01/2023   CREATININE 0.82 07/01/2023   BCR 24 (H) 07/01/2023   GFRAA 100 05/24/2020   GFRNONAA 87 05/24/2020     Electrolytes Lab Results  Component Value Date   NA 140 07/01/2023   K 4.4 07/01/2023   CL 104 07/01/2023   CALCIUM 10.6 (H) 09/20/2023   MG 2.1 06/27/2021     Hepatic Lab Results  Component Value Date   AST 25 09/20/2023   ALT 45 (H) 09/20/2023   ALBUMIN 4.4 09/20/2023   ALKPHOS 177 (H) 09/20/2023     ID Lab Results  Component Value Date   HIV Non Reactive 09/20/2023   SARSCOV2NAA NEGATIVE 05/30/2020     Bone Lab Results  Component Value Date   VD25OH 20.5 (L) 07/01/2023     Endocrine Lab Results  Component Value Date   GLUCOSE 89 07/01/2023   HGBA1C 5.4 07/01/2023   TSH 3.590 07/01/2023   FREET4 0.84 02/26/2023     Neuropathy Lab Results  Component Value Date   VITAMINB12 467 02/26/2023   HGBA1C 5.4 07/01/2023   HIV Non Reactive 09/20/2023     CNS No results found for: COLORCSF, APPEARCSF, RBCCOUNTCSF, WBCCSF, POLYSCSF, LYMPHSCSF, EOSCSF, PROTEINCSF, GLUCCSF, JCVIRUS, CSFOLI, IGGCSF, LABACHR, ACETBL   Inflammation (CRP: Acute  ESR: Chronic) No results found for: CRP, ESRSEDRATE,  LATICACIDVEN   Rheumatology Lab Results  Component Value Date   ANA Negative 09/20/2023     Coagulation Lab Results  Component Value Date   PLT 288 07/01/2023     Cardiovascular Lab Results  Component Value Date   HGB 13.3 07/01/2023   HCT 40.7 07/01/2023     Screening Lab Results  Component Value Date   SARSCOV2NAA NEGATIVE 05/30/2020   HIV  Non Reactive 09/20/2023     Cancer No results found for: Curry, CA125, LABCA2   Allergens No results found for: ALMOND, APPLE, ASPARAGUS, AVOCADO, BANANA, BARLEY, BASIL, BAYLEAF, GREENBEAN, LIMABEAN, WHITEBEAN, BEEFIGE, REDBEET, BLUEBERRY, BROCCOLI, CABBAGE, MELON, CARROT, CASEIN, CASHEWNUT, CAULIFLOWER, CELERY     Note: Lab results reviewed.  PFSH  Drug: Heather Curry  reports no history of drug use. Alcohol:  reports current alcohol use. Tobacco:  reports that she quit smoking about 16 years ago. Her smoking use included cigarettes. She has never used smokeless tobacco. Medical:  has a past medical history of Colon polyps and Hypertension. Family: family history includes Alcohol abuse in her father; Arthritis in her maternal grandmother and mother; Breast cancer (age of onset: 57) in her maternal aunt; COPD in her father; Cancer in her maternal grandmother; Congestive Heart Failure in her mother; Hypertension in her father, mother, and sister.  Past Surgical History:  Procedure Laterality Date   APPENDECTOMY     BREAST BIOPSY Right 2007   neg   BREAST BIOPSY Right 09/27/2020   affirm breast bx, x marker, BENIGN MAMMARY PARENCHYMA WITH DENSE STROMAL FIBROSIS,   BREAST SURGERY Left    Lumpectomy   CERVIX REMOVAL     COLONOSCOPY WITH PROPOFOL  N/A 04/18/2018   Procedure: COLONOSCOPY WITH PROPOFOL ;  Surgeon: Janalyn Keene NOVAK, MD;  Location: [facility] ENDOSCOPY;  Service: Gastroenterology;  Laterality: N/A;   COLONOSCOPY WITH PROPOFOL  N/A 04/17/2019   Procedure: COLONOSCOPY WITH PROPOFOL ;  Surgeon: Janalyn Keene NOVAK, MD;  Location: [facility] ENDOSCOPY;  Service: Endoscopy;  Laterality: N/A;   COLONOSCOPY WITH PROPOFOL  N/A 06/01/2020   Procedure: COLONOSCOPY WITH PROPOFOL ;  Surgeon: Janalyn Keene NOVAK, MD;  Location: [facility] ENDOSCOPY;  Service: Endoscopy;  Laterality: N/A;   COLONOSCOPY WITH PROPOFOL  N/A 06/01/2022   Procedure: COLONOSCOPY WITH  PROPOFOL ;  Surgeon: Unk Corinn Skiff, MD;  Location: [facility] ENDOSCOPY;  Service: Gastroenterology;  Laterality: N/A;   Active Ambulatory Problems    Diagnosis Date Noted   Benign neoplasm of ascending colon    Benign neoplasm of cecum    Annual physical exam 06/27/2021   Essential hypertension 06/27/2021   Encounter for screening mammogram for malignant neoplasm of breast 06/29/2022   Menopausal vasomotor syndrome 06/29/2022   Hypothyroidism 02/26/2023   Posttraumatic stress disorder 02/26/2023   Chronic major depressive disorder, recurrent episode (HCC) 02/26/2023   Avitaminosis D 07/01/2023   Elevated liver enzymes 07/01/2023   Mixed hyperlipidemia 07/01/2023   Chronic lumbar radiculopathy (Bilateral) 07/01/2023   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, L3-4) 07/01/2023   Impaired glucose metabolism 07/01/2023   Screening mammogram for breast cancer 07/01/2023   Asthma 04/14/2024   Asymptomatic bradycardia 04/14/2024   Chest pain, unspecified 04/14/2024   Encounter for other administrative examinations 04/14/2024   Chronic pain syndrome 04/14/2024   Pharmacologic therapy 04/14/2024   Disorder of skeletal system 04/14/2024   Problems influencing health status 04/14/2024   Abnormal MRI, lumbar spine (05/04/2023) 04/14/2024   Abnormal MRI, cervical spine (09/12/2023) 04/14/2024   Cervicalgia 04/14/2024   Chronic neck and back pain (1ry area  of Pain) 04/14/2024   Chronic shoulder pain (2ry area of Pain) (Bilateral) 04/14/2024   Chronic upper extremity pain (3ry area of Pain) (Right) 04/14/2024   Cervical radiculopathy at C7 (Right) 04/14/2024   Cervical radiculopathy at C8 (Right) 04/14/2024   Cervical radiculopathy (Right) 04/14/2024   Cervical disc disorder with radiculopathy, cervicothoracic region 04/14/2024   Radicular pain of shoulder (Bilateral) 04/14/2024   Chronic low back pain (4th area of Pain) (Bilateral) w/ sciatica (Bilateral) 04/14/2024    Chronic low back pain (Bilateral) w/o sciatica 04/14/2024   Chronic hip pain (Bilateral) 04/14/2024   Chronic lower extremity pain (5th area of Pain) (Bilateral) 04/14/2024   Chronic lower extremity radicular pain (Bilateral) 04/14/2024   Lumbar facet joint pain 04/14/2024   Cervical facet joint pain 04/14/2024   Lumbar radiculitis (Bilateral) 04/14/2024   Lumbosacral radiculopathy at L5 (Bilateral) 04/14/2024   Lumbosacral radiculopathy at S1 (Bilateral) 04/14/2024   Chronic upper back pain 04/14/2024   Chronic thoracic back pain (Bilateral) 04/14/2024   Cervical facet arthropathy 04/14/2024   Cervical facet joint syndrome 04/14/2024   DDD (degenerative disc disease), cervical 04/14/2024   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, 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) 04/14/2024   Cervical spinal stenosis (C5-6) 04/14/2024   Chronic sacroiliac joint pain (Right) 04/14/2024   Thoracolumbar spine levoscoliosis 04/14/2024   Lumbar facet arthropathy (Multilevel) (Bilateral) 04/14/2024   DDD (degenerative disc disease), lumbar w/ LEP 04/14/2024   Lumbar intervertebral disc protrusion (Right: T12-L1) (Left: L2-3) 04/14/2024   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, L3-4) 04/14/2024   Lumbar spondylosis 04/14/2024   Low back pain of over 3 months duration 04/14/2024   Low back pain potentially associated with radiculopathy 04/14/2024   Low back pain radiating to legs (Bilateral) 04/14/2024   Multifactorial low back pain 04/14/2024   Resolved Ambulatory Problems    Diagnosis Date Noted   Positive colorectal cancer screening using Cologuard test    Diverticulosis of large intestine without diverticulitis    History of colonic polyps    Polyp of colon    Varicose veins of bilateral lower extremities with pain 06/27/2021   Reactive depression 06/27/2021   White coat syndrome with high blood pressure without hypertension 06/27/2021   Screening for diabetes mellitus 06/27/2021   Encounter for lipid  screening for cardiovascular disease 06/27/2021   Acute pain of right knee 06/27/2021   Chronic fatigue 06/27/2021   Need for influenza vaccination 06/29/2022   Need for shingles vaccine 06/29/2022   Mild intermittent asthma without complication 06/29/2022   Acute left-sided low back pain with left-sided sciatica 02/26/2023   Weight gain finding 02/26/2023   Past Medical History:  Diagnosis Date   Colon polyps    Hypertension    Constitutional Exam  General appearance: Well nourished, well developed, and well hydrated. In no apparent acute distress Vitals:   04/14/24 1320  BP: 130/86  Pulse: 60  Resp: 16  Temp: 98.2 F (36.8 C)  TempSrc: Temporal  SpO2: 100%  Weight: 178 lb (80.7 kg)  Height: 5' 4 (1.626 m)   BMI Assessment: Estimated body mass index is 30.55 kg/m as calculated from the following:   Height as of this encounter: 5' 4 (1.626 m).   Weight as of this encounter: 178 lb (80.7 kg).  BMI interpretation table: BMI level Category Range association with higher incidence of chronic pain  <18 kg/m2 Underweight   18.5-24.9 kg/m2 Ideal body weight   25-29.9 kg/m2 Overweight Increased incidence by 20%  30-34.9 kg/m2 Obese (Class  I) Increased incidence by 68%  35-39.9 kg/m2 Severe obesity (Class II) Increased incidence by 136%  >40 kg/m2 Extreme obesity (Class III) Increased incidence by 254%   Patient's current BMI Ideal Body weight  Body mass index is 30.55 kg/m. Ideal body weight: 54.7 kg (120 lb 9.5 oz) Adjusted ideal body weight: 65.1 kg (143 lb 8.9 oz)   BMI Readings from Last 4 Encounters:  04/14/24 30.55 kg/m  11/13/23 30.73 kg/m  09/20/23 31.27 kg/m  07/01/23 29.51 kg/m   Wt Readings from Last 4 Encounters:  04/14/24 178 lb (80.7 kg)  11/13/23 179 lb (81.2 kg)  09/20/23 182 lb 3.2 oz (82.6 kg)  07/01/23 171 lb 14.4 oz (78 kg)    Psych/Mental status: Alert, oriented x 3 (person, place, & time)       Eyes: PERLA Respiratory: No evidence of  acute respiratory distress Physical Exam   MUSCULOSKELETAL: Pain in the right hip and posterior thigh on straight leg raise. No pain on left straight leg raise, slight tightness posteriorly. Pain in the right hip and sacroiliac joint on Patrick maneuver. Pain in the left groin on Patrick maneuver. Pain in the right hip and sacroiliac joint with pressure on Patrick maneuver.       Assessment  Primary Diagnosis & Pertinent Problem List: The primary encounter diagnosis was Chronic neck and back pain. Diagnoses of Cervicalgia, Pain of cervical facet joint, Cervical disc disorder with radiculopathy, cervicothoracic region, Chronic pain of right upper extremity, Right cervical radiculopathy, Cervical radiculopathy at C7, Cervical radiculopathy at C8, Chronic pain of both shoulders, Radicular pain of shoulder, Chronic upper back pain, Chronic bilateral thoracic back pain, Chronic bilateral low back pain without sciatica, Lumbar facet joint pain, Chronic bilateral low back pain with bilateral sciatica, Chronic pain of lower extremity, bilateral, Chronic hip pain, bilateral, Chronic radicular pain of lower extremity, Lumbar radiculitis, Lumbosacral radiculopathy at L5, Lumbosacral radiculopathy at S1, Abnormal MRI, lumbar spine (05/04/2023), Abnormal MRI, cervical spine (09/12/2023), Chronic pain syndrome, Pharmacologic therapy, Disorder of skeletal system, Problems influencing health status, Elevated liver enzymes, Avitaminosis D, Encounter for chronic pain management, Cervical facet arthropathy, Cervical facet joint syndrome, DDD (degenerative disc disease), cervical, Cervical foraminal stenosis, Cervical spinal stenosis, Chronic sacroiliac joint pain (Right), Thoracolumbar spine levoscoliosis, Lumbar facet arthropathy (Multilevel) (Bilateral), DDD (degenerative disc disease), lumbar w/ LEP, Lumbar intervertebral disc protrusion (Right: T12-L1) (Left: L2-3), 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  Visit Diagnosis (New problems to examiner): 1. Chronic neck and back pain   2. Cervicalgia   3. Pain of cervical facet joint   4. Cervical disc disorder with radiculopathy, cervicothoracic region   5. Chronic pain of right upper extremity   6. Right cervical radiculopathy   7. Cervical radiculopathy at C7   8. Cervical radiculopathy at C8   9. Chronic pain of both shoulders   10. Radicular pain of shoulder   11. Chronic upper back pain   12. Chronic bilateral thoracic back pain   13. Chronic bilateral low back pain without sciatica   14. Lumbar facet joint pain   15. Chronic bilateral low back pain with bilateral sciatica   16. Chronic pain of lower extremity, bilateral   17. Chronic hip pain, bilateral   18. Chronic  radicular pain of lower extremity   19. Lumbar radiculitis   20. Lumbosacral radiculopathy at L5   21. Lumbosacral radiculopathy at S1   22. Abnormal MRI, lumbar spine (05/04/2023)   23. Abnormal MRI, cervical spine (09/12/2023)   24. Chronic pain syndrome   25. Pharmacologic therapy   26. Disorder of skeletal system   27. Problems influencing health status   28. Elevated liver enzymes   29. Avitaminosis D   30. Encounter for chronic pain management   31. Cervical facet arthropathy   32. Cervical facet joint syndrome   33. DDD (degenerative disc disease), cervical   34. Cervical foraminal stenosis   35. Cervical spinal stenosis   36. Chronic sacroiliac joint pain (Right)   37. Thoracolumbar spine levoscoliosis   38. Lumbar facet arthropathy (Multilevel) (Bilateral)   39. DDD (degenerative disc disease), lumbar w/ LEP   40. Lumbar intervertebral disc protrusion (Right: T12-L1) (Left:  L2-3)   41. 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 Lumbar spondylosis   44. Low back pain of over 3 months duration   45. Low back pain potentially associated with radiculopathy   46. Low back pain radiating to both legs   47. Multifactorial low back pain    Plan of Care (Initial workup plan)  Note: Heather Curry was reminded that as per protocol, today's visit has been an evaluation only. We have not taken over the patient's controlled substance management.  Problem-specific plan: Assessment and Plan    Chronic back pain with right-sided radiculopathy   Chronic back pain with radiculopathy affects the lumbosacral region. 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 It radiates from the upper spine into the right arm and between the shoulder blades, extending to the lower back, hips, and back of the thighs, with tingling sensations to the top of the feet. A positive straight leg raise test on the right side indicates radiculopathy. Differential diagnosis includes arthritis or a pinched nerve.  Left groin pain   Left groin pain is noted during the Patrick maneuver, with no associated back pain. The pain is localized to the left groin area without radiation.       Lab Orders         Compliance Drug Analysis, Ur         Comp. Metabolic Panel (12)         Magnesium         Vitamin B12         Sedimentation rate         25-Hydroxy vitamin D  Lcms D2+D3         C-reactive protein     Imaging Orders         DG Cervical Spine With Flex & Extend         DG Thoracic Spine W/Swimmers         DG Lumbar Spine Complete W/Bend         DG Shoulder Right         DG Shoulder Left         DG HIPS BILAT W OR W/O PELVIS MIN 5 VIEWS     Referral Orders  No referral(s) requested today   Procedure Orders    No procedure(s) ordered today   Pharmacotherapy  (current): Medications ordered:  Meds ordered this encounter  Medications   predniSONE  (DELTASONE ) 20 MG tablet  Sig: Take 3 tablets (60 mg total) by mouth daily with breakfast for 3 days, THEN 2 tablets (40 mg total) daily with breakfast for 3 days, THEN 1 tablet (20 mg total) daily with breakfast for 3 days.    Dispense:  18 tablet    Refill:  0   Medications administered during this visit: Johnetta D. Virts Dianne had no medications administered during this visit.   Analgesic Pharmacotherapy:  Opioid Analgesics: For patients currently taking or requesting to take opioid analgesics, in accordance with [place]  Medical Board Guidelines, we will assess their risks and indications for the use of these substances. After completing our evaluation, we may Curry recommendations, but we no longer take patients for medication management. The prescribing physician will ultimately decide, based on his/her training and level of comfort whether to adopt any of the recommendations, including whether or not to prescribe such medicines.  Membrane stabilizer: To be determined at a later time  Muscle relaxant: To be determined at a later time  NSAID: To be determined at a later time  Other analgesic(s): To be determined at a later time   Interventional management options: Heather Curry was informed that there is no guarantee that she would be a candidate for interventional therapies. The decision will be based on the results of diagnostic studies, as well as Heather Curry's risk profile.  Procedure(s) under consideration:  Pending results of ordered studies     Interventional Therapies  Risk Factors  Considerations  Medical Comorbidities:     Planned  Pending:      Under consideration:   Pending   Completed: (Analgesic benefit)1  None at this time   Therapeutic  Palliative (PRN) options:   None established   Completed by other providers:   None reported  1(Analgesic benefit):  Expressed in percentage (%). (Local anesthetic[LA] +/- sedation  L.A.Local Anesthetic  Steroid benefit  Ongoing benefit)   Provider-requested follow-up: Return in about 2 weeks (around 04/28/2024) for ( ), Eval-day (M,W), (Face2F), 2nd Visit, to review of ordered test(s).  Future Appointments  Date Time Provider Department Center  05/11/2024  8:00 AM Tanya Glisson, MD [facility] None    I discussed the assessment and treatment plan with the patient. The patient was provided an opportunity to ask questions and all were answered. The patient agreed with the plan and demonstrated an understanding of the instructions.  Patient advised to call back or seek an in-person evaluation if the symptoms or condition worsens.  Duration of encounter: 123 minutes.  Total time on encounter, as per AMA guidelines included both the face-to-face and non-face-to-face time personally spent by the physician and/or other qualified health care professional(s) on the day of the encounter (includes time in activities that require the physician or other qualified health care professional and does not include time in activities normally performed by clinical staff). Physician's time may include the following activities when performed: Preparing to see the patient (e.g., pre-charting review of records, searching for previously ordered imaging, lab work, and nerve conduction tests) Review of prior analgesic pharmacotherapies. Reviewing PMP Interpreting ordered tests (e.g., lab work, imaging, nerve conduction tests) Performing post-procedure evaluations, including interpretation of diagnostic procedures Obtaining and/or reviewing separately obtained history Performing a medically appropriate examination and/or evaluation Counseling and educating the patient/family/caregiver Ordering medications, tests, or procedures Referring and communicating with other health care professionals (when not separately  reported) Documenting clinical information in the electronic or other health record Independently interpreting results (not separately reported) and communicating results  to the patient/ family/caregiver Care coordination (not separately reported)  Note by: Eric DELENA Como, MD (TTS and AI technology used. I apologize for any typographical errors that were not detected and corrected.) Date: 04/14/2024; Time: 5:39 PM

## 2024-04-14 NOTE — Patient Instructions (Addendum)
 ______________________________________________________________________    New Patients  Welcome to [facility].   Initial Visit The first or initial visit consists of an evaluation only.   Interventional pain management.  This is an interventional pain management medical practice. This means that we offer therapies other than prescription controlled substances to manage chronic pain. These therapies may include, but are not limited to, diagnostic, therapeutic, and palliative specialized injection therapies (i.e.: Epidural Steroids, Facet Blocks, etc.). We specialize in a variety of nerve blocks as well as radiofrequency treatments. We offer pain implant evaluations and trials, as well as follow up management. In addition we also provide a variety joint injections, including Viscosupplementation (AKA: Gel Therapy).  Prescription Pain Medication. We specialize in alternatives to opioids.  Although we can provide evaluations and recommendations for/of pharmacologic therapies based on CDC Guidelines, we no longer take patients for long-term medication management. Please be clear that we will not be taking over the prescribing of your pain medications.  ______________________________________________________________________      ______________________________________________________________________    Patient Information update  To: All of our patients.  Re: Name change.  It has been made official that our current name, "[facility]"   will soon be changed to "[facility]".   The purpose of this change is to eliminate any confusion created by the concept of our practice being a "Medication Management Pain Clinic". In the past this has led to the misconception that we treat pain primarily by the use of prescription  medications.  Nothing can be farther from the truth.   Understanding PAIN MANAGEMENT: To further understand what our practice does, you first have to understand that "Pain Management" is a subspecialty that requires additional training once a physician has completed their specialty training, which can be in either Anesthesia, Neurology, Psychiatry, or Physical Medicine and Rehabilitation (PMR). Each one of these contributes to the final approach taken by each physician to the management of their patient's pain. To be a "Pain Management Specialist" you must have first completed one of the specialty trainings below.  Anesthesiologists - trained in clinical pharmacology and interventional techniques such as nerve blockade and regional as well as central neuroanatomy. They are trained to block pain before, during, and after surgical interventions.  Neurologists - trained in the diagnosis and pharmacological treatment of complex neurological conditions, such as Multiple Sclerosis, Parkinson's, spinal cord injuries, and other systemic conditions that may be associated with symptoms that may include but are not limited to pain. They tend to rely primarily on the treatment of chronic pain using prescription medications.  Psychiatrist - trained in conditions affecting the psychosocial wellbeing of patients including but not limited to depression, anxiety, schizophrenia, personality disorders, addiction, and other substance use disorders that may be associated with chronic pain. They tend to rely primarily on the treatment of chronic pain using prescription medications.   Physical Medicine and Rehabilitation (PMR) physicians, also known as physiatrists - trained to treat a wide variety of medical conditions affecting the brain, spinal cord, nerves, bones, joints, ligaments, muscles, and tendons. Their training is primarily aimed at treating patients that have suffered injuries that have caused severe physical  impairment. Their training is primarily aimed at the physical therapy and rehabilitation of those patients. They may also work alongside orthopedic surgeons or neurosurgeons using their expertise in assisting surgical patients to recover after their surgeries.  INTERVENTIONAL PAIN MANAGEMENT is  sub-subspecialty of Pain Management.  Our physicians are Board-certified in Anesthesia, Pain Management, and Interventional Pain Management.  This meaning that not only have they been trained and Board-certified in their specialty of Anesthesia, and subspecialty of Pain Management, but they have also received further training in the sub-subspecialty of Interventional Pain Management, in order to become Board-certified as INTERVENTIONAL PAIN MANAGEMENT SPECIALIST.    Mission: Our goal is to use our skills in  INTERVENTIONAL PAIN MANAGEMENT as alternatives to the chronic use of prescription opioid medications for the treatment of pain. To make this more clear, we have changed our name to reflect what we do and offer. We will continue to offer medication management assessment and recommendations, but we will not be taking over any patient's medication management.  ______________________________________________________________________       ______________________________________________________________________    Appointment Information  It is our goal and responsibility to provide the medical community with assistance in the evaluation and management of patients with chronic pain. Unfortunately our resources are limited. Because we do not have an unlimited amount of time, or available appointments, we are required to closely monitor for unkept or cancelled appointments.  Patient's responsibilities: 1. Punctuality: Patients are required to be physically present in our office at least 15 minutes before their scheduled appointment. 2. Tardiness: Patients not physically present in our office at their scheduled  appointment time will be rescheduled. 3. Plan ahead: Assume that you will encounter traffic and plan to arrive 30 minutes before your appointment. 4. Other appointments and responsibilities: Do not schedule other appointments immediately before or after your scheduled appointment.  5. Be prepared: Make a list of everything that you need to discuss with your provider so that you use your time efficiently. Once the provider leaves your room, he/she will not return to your room to discuss anything that you neglected to bring up during your allowed time. 6. No children or pets: Do not bring children or pets to your appointment. 7. Cancelling or rescheduling your appointment: Advanced notification (more than 24 hours in advance) is required. 8. No Show: Not calling to cancel an appointment and simply not showing up is unacceptable. This leads to loss of appointments that could have been used by a patient in need. (See below)  Corrective process for repeat offenders:  No Shows: Three (3) No Shows within a 12 month period will result in an automatic discharge from our practice. Rescheduling or cancelling with more than 24 hours notice will not be penalized and will not count against you. Tardiness: If you have to be rescheduled three (3) times due to late arrivals, it will be counted as one (1) No Show. Cancellation or reschedule: Three (3) cancellations or rescheduling where notice was given with less than 24 hours in advance, will be recorded as one (1) No Show.  Types of appointments: New patient initial evaluation: These are evaluations only. Your initial patient questionnaire will be collected and entered into the system. A history of present illness will be taken. Prior lab work, imaging studies, and associated treatments will be reviewed. The provider may order appropriate diagnostic testing depending on their evaluation and review of available information. No treatments will be started on  this visit. 2nd Follow-up visit: During this visit your provider will inform you of the results of the diagnostic tests ordered on the initial evaluation. Based on the providers assessment, treatment options will be offered, at which the patient will decide if he/she is interested in the alternatives. If  interested, a treatment plan will be established and started. Procedure visits: Post-procedure evaluation visits: Evaluation visits MM New problems Flare-up evaluations Follow-up after diagnostic testing ______________________________________________________________________    ______________________________________________________________________    TENS (Device can be purchased online, without prescription. Search: TENS 7000.) Transcutaneous electrical nerve stimulation (TENS) is a method of pain relief that involves the use of mild electrical stimulation. A TENS machine is a small, battery-operated device that has leads connected to sticky pads called electrodes. Available at [employer]. (Estimated price as of July 10th, 2025.)  (Estimated [employer] cost: $38.88) Rechargeable 9V batteries:  (Estimated [employer] cost: $12.98)  Larger Reusable 2 x 4 TENS Pads/Electrodes:  (Estimated [employer] cost: $9.99)  Total cost: $61.85    ELECTRODE PLACEMENT:   TENS UNIT SAFETY WARNING SHEET and INFORMATION INDICATIONS AND CONTRAINDICTIONS Read the operation manual before using the device. Federal law ([place] ) restricts this device to sale by or on the order of a physician. Observe your physician's precise instructions and let him show you where to apply the electrodes. For a successful therapy, the correct application of the electrodes is an important factor. Carefully write down the settings your physician recommended. Indications for use This device is a prescription device and only for symptomatic relief of chronic intractable pain.  Contraindications:   Any electrode placement that applies current  to the carotid sinus (neck) region.   Patients with implanted electronic devices (for example, a pacemaker) or metallic implants should not undertake.   Any electrode placement that causes current to flow transcerebrally (through the head). The use of unit whenever pain symptoms are undiagnosed, unit etiology is determined.   The use of TENS whenever pain syndromes are undiagnosed, until etiology is established.   WARNINGS AND PRECAUTIONS  Warnings:   The device must be kept out of reach of children.   The safety of device for use during pregnancy or delivery has not been established.   Do not place electrodes on front of the throat. This may result in spasms of the laryngeal and pharyngeal muscles.   Do not place the electrodes over the carotid nerve (side of neck below ear).   The device is not effective for pain of central origin (headaches).   The device may interfere with electronic monitoring equipment (such as ECG monitors and ECG alarms).   Electrodes should not be placed over the eyes, in the mouth, or internally.   These devices have no curative value.   TENS devices should be used only under the continued supervision of a physician.   TENS is a symptomatic treatment and as such suppresses the sensation of pain which would otherwise serve as a protective mechanism. Precautions/Adverse Reactions   Isolated cases of skin irritation may occur at the site of electrode placement following long-term application.   Stimulation should be stopped and electrodes removed until the cause of the irritation can be determined.   Effectiveness is highly dependent upon patient selection by a person qualified in the management of pain patients.   If the device treatment becomes ineffective or unpleasant, stimulation should be discontinued until reevaluation by a physician/clinician.   Always turn the device off before applying or removing electrodes.   Skin irritation and electrode burns are potential adverse  reactions.  PURPOSE: A Transcutaneous Electrical Nerve Stimulator, or TENS, unit is designed to relieve post-operative, acute and chronic pain. It is used for pain caused by peripheral nerves and not central. TENS units are prescription-only devices.   OPERATION: TENS units work in a  couple of ways. The first way they are thought to work is by a method called the [employer]. The [employer] states that our brains can only handle one stimulus at a time. When you have chronic pain, this pain signal is constantly being sent to your brain and recognized as pain. When an electrical stimulus is added to the area of pain the body feels this electrical stimulus, and since the brain can only handle one thing at a time, the pain is not transmitted to the brain. The second method thought to be part of TENS unit's success is by way of stimulating our own bodies to release their own natural painkillers. TENS units do not work for everyone and results may vary. Always follow the instructions and warnings in your user's manual.   USE: One of the most important tasks that must be performed is battery maintenance. If you are using a Engineering ge[employer], always fully charge it and fully deplete it before charging it again. These batteries can develop memories and by not performing this charging task correctly, your battery's life can be greatly diminished. If your battery does develop a memory you can help expand the memory by charging for 12 - 13 hours and then completely depleting the battery. Always prepare the skin before applying electrodes. Your skin should be clean and free of any lotions or creams. If you are using electrodes that use conductive gel, apply a small, even layer over the electrode. For carbon, self-adhesive electrodes, apply a drop of water to the electrodes before applying to the skin. The electrodes attach to the lead wires and then the TENS unit. Always grasp the connector and not the  cord when inserting or removing. When making adjustments, always make sure the unit's channels (1 and 2) are in the OFF position. The actual settings should be recommended and prescribed by your physician. Medical equipment suppliers don'tset or instruct users as to user settings. When you are using the BURST mode, the unit delivers a series of quick pulses followed by a rest. This cycle repeats itself frequently. Always have channels OFF before changing modes.   For MODULATION mode, the stimulation automatically varies the width of the pulse.   For CONVENTIONAL mode, the stimulation is constant. After the settings have been fine-tuned, set the timer to 30 or 60 minutes. Your physician should also prescribe the use time. When the lights become dim, it means your batteries should be replaced or recharged.   ACCESSORIES: The electrodes and lead wires can be obtained from your medical equipment supplier. Your medical equipment supplier can set up a recurring delivery to accommodate your needs. Electrodes should be replaced once a month and lead wires once every 6 months.  Video Tutorial [email or web address]  ______________________________________________________________________

## 2024-04-17 LAB — COMPLIANCE DRUG ANALYSIS, UR

## 2024-04-18 ENCOUNTER — Ambulatory Visit
Admission: RE | Admit: 2024-04-18 | Discharge: 2024-04-18 | Disposition: A | Discharge status: Home / Self Care | Source: Ambulatory Visit | Attending: Pain Medicine | Admitting: Pain Medicine

## 2024-04-18 DIAGNOSIS — M5442 Lumbago with sciatica, left side: Secondary | ICD-10-CM | POA: Diagnosis present

## 2024-04-18 DIAGNOSIS — M25551 Pain in right hip: Secondary | ICD-10-CM | POA: Diagnosis present

## 2024-04-18 DIAGNOSIS — M25511 Pain in right shoulder: Secondary | ICD-10-CM | POA: Insufficient documentation

## 2024-04-18 DIAGNOSIS — M79604 Pain in right leg: Secondary | ICD-10-CM | POA: Insufficient documentation

## 2024-04-18 DIAGNOSIS — M549 Dorsalgia, unspecified: Secondary | ICD-10-CM

## 2024-04-18 DIAGNOSIS — M542 Cervicalgia: Secondary | ICD-10-CM | POA: Diagnosis present

## 2024-04-18 DIAGNOSIS — M545 Low back pain, unspecified: Secondary | ICD-10-CM | POA: Diagnosis present

## 2024-04-18 DIAGNOSIS — M5013 Cervical disc disorder with radiculopathy, cervicothoracic region: Secondary | ICD-10-CM

## 2024-04-18 DIAGNOSIS — M5410 Radiculopathy, site unspecified: Secondary | ICD-10-CM | POA: Insufficient documentation

## 2024-04-18 DIAGNOSIS — M546 Pain in thoracic spine: Secondary | ICD-10-CM | POA: Diagnosis present

## 2024-04-18 DIAGNOSIS — M5412 Radiculopathy, cervical region: Secondary | ICD-10-CM

## 2024-04-18 DIAGNOSIS — M25512 Pain in left shoulder: Secondary | ICD-10-CM | POA: Insufficient documentation

## 2024-04-18 DIAGNOSIS — M5459 Other low back pain: Secondary | ICD-10-CM | POA: Diagnosis present

## 2024-04-18 DIAGNOSIS — M5417 Radiculopathy, lumbosacral region: Secondary | ICD-10-CM | POA: Diagnosis present

## 2024-04-18 DIAGNOSIS — M79605 Pain in left leg: Secondary | ICD-10-CM | POA: Diagnosis present

## 2024-04-18 DIAGNOSIS — M5441 Lumbago with sciatica, right side: Secondary | ICD-10-CM | POA: Diagnosis present

## 2024-04-18 DIAGNOSIS — G8929 Other chronic pain: Secondary | ICD-10-CM | POA: Diagnosis present

## 2024-04-18 DIAGNOSIS — M25552 Pain in left hip: Secondary | ICD-10-CM | POA: Diagnosis present

## 2024-04-18 DIAGNOSIS — M5416 Radiculopathy, lumbar region: Secondary | ICD-10-CM

## 2024-04-18 DIAGNOSIS — M79601 Pain in right arm: Secondary | ICD-10-CM | POA: Insufficient documentation

## 2024-04-18 DIAGNOSIS — R937 Abnormal findings on diagnostic imaging of other parts of musculoskeletal system: Secondary | ICD-10-CM | POA: Diagnosis present

## 2024-04-19 ENCOUNTER — Encounter: Payer: Self-pay | Admitting: Pain Medicine

## 2024-04-19 DIAGNOSIS — R892 Abnormal level of other drugs, medicaments and biological substances in specimens from other organs, systems and tissues: Secondary | ICD-10-CM | POA: Insufficient documentation

## 2024-04-19 DIAGNOSIS — F1290 Cannabis use, unspecified, uncomplicated: Secondary | ICD-10-CM | POA: Insufficient documentation

## 2024-04-20 LAB — 25-HYDROXY VITAMIN D LCMS D2+D3
25-Hydroxy, Vitamin D-2: <1 ng/mL
25-Hydroxy, Vitamin D-3: 32 ng/mL
25-Hydroxy, Vitamin D: 32 ng/mL

## 2024-04-20 LAB — C-REACTIVE PROTEIN: CRP: <1 mg/L (ref 0–10)

## 2024-04-20 LAB — COMP. METABOLIC PANEL (12)
AST: 22 IU/L (ref 0–40)
Albumin: 4.6 g/dL (ref 3.8–4.9)
Alkaline Phosphatase: 175 IU/L — ABNORMAL HIGH (ref 44–121)
BUN/Creatinine Ratio: 14 (ref 9–23)
BUN: 13 mg/dL (ref 6–24)
Bilirubin Total: 0.4 mg/dL (ref 0.0–1.2)
Calcium: 10.6 mg/dL — ABNORMAL HIGH (ref 8.7–10.2)
Chloride: 100 mmol/L (ref 96–106)
Creatinine, Ser: 0.95 mg/dL (ref 0.57–1.00)
Globulin, Total: 2.5 g/dL (ref 1.5–4.5)
Glucose: 78 mg/dL (ref 70–99)
Potassium: 4.2 mmol/L (ref 3.5–5.2)
Sodium: 139 mmol/L (ref 134–144)
Total Protein: 7.1 g/dL (ref 6.0–8.5)
eGFR: 70 mL/min/1.73 (ref >59)

## 2024-04-20 LAB — MAGNESIUM: Magnesium: 2.2 mg/dL (ref 1.6–2.3)

## 2024-04-20 LAB — VITAMIN B12: Vitamin B-12: 526 pg/mL (ref 232–1245)

## 2024-04-20 LAB — SEDIMENTATION RATE: Sed Rate: 13 mm/h (ref 0–40)

## 2024-05-10 NOTE — Progress Notes (Unsigned)
 PROVIDER NOTE: Interpretation of information contained herein should be left to medically-trained personnel. Specific patient instructions are provided elsewhere under Patient Instructions section of medical record. This document was created in part using AI and STT-dictation technology, any transcriptional errors that may result from this process are unintentional.  Patient: Heather Curry  Service: E/M   PCP: Heather Kelly DASEN, FNP (Inactive)  DOB: Dec 28, 1965  DOS: 05/11/2024  Provider: Eric DELENA Como, MD  MRN: [id number]  Delivery: Face-to-face  Specialty: Interventional Pain Management  Type: Established Patient  Setting: Ambulatory outpatient facility  Specialty designation: 09  Referring Prov.: No ref. provider found  Location: Outpatient office facility       Primary Reason(s) for Visit: Encounter for evaluation before starting new chronic pain management plan of care (Level of risk: moderate) CC: No chief complaint on file.  HPI  Heather Curry is a 58 y.o. year old, female patient, who comes today for a follow-up evaluation to review the test results and decide on a treatment plan. 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, L3-4); Impaired glucose metabolism; Screening mammogram for breast cancer; Asthma; Asymptomatic bradycardia; Chest pain, unspecified; Encounter for other administrative examinations; Chronic pain syndrome; Pharmacologic therapy; Disorder of skeletal system; Problems influencing health status; Abnormal MRI, lumbar spine (05/04/2023);  Abnormal MRI, cervical spine (09/12/2023); Cervicalgia; Chronic neck and back pain (1ry area of Pain); Chronic shoulder pain (2ry area of Pain) (Bilateral); Chronic upper extremity pain (3ry area of Pain) (Right); Cervical radiculopathy at C7 (Right); Cervical radiculopathy at C8 (Right); Cervical radiculopathy (Right); Cervical disc disorder with radiculopathy, cervicothoracic region; Radicular pain of shoulder (Bilateral); Chronic low back pain (4th area of Pain) (Bilateral) w/ sciatica (Bilateral); Chronic low back pain (Bilateral) w/o sciatica; Chronic hip pain (Bilateral); Chronic lower extremity pain (5th area of Pain) (Bilateral); Chronic lower extremity radicular pain (Bilateral); Lumbar facet joint pain; Cervical facet joint pain; Lumbar radiculitis (Bilateral); Lumbosacral radiculopathy at L5 (Bilateral); Lumbosacral radiculopathy at S1 (Bilateral); Chronic upper back pain; Chronic thoracic back pain (Bilateral); Cervical facet arthropathy; Cervical facet joint syndrome; DDD (degenerative disc disease), cervical; 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, 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); Cervical spinal stenosis (C5-6); Chronic sacroiliac joint pain (Right); Thoracolumbar spine levoscoliosis; Lumbar facet arthropathy (Multilevel) (Bilateral); DDD (degenerative disc disease), lumbar w/ LEP; Lumbar intervertebral disc protrusion (Right: T12-L1) (Left: L2-3); 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, L3-4); Lumbar spondylosis; Low back pain of over 3 months duration; Low back pain potentially associated with radiculopathy; Low back pain radiating to legs (Bilateral); Multifactorial low back pain; Abnormal drug screen (04/14/24); and Marijuana use on their problem list. Her primarily concern today is the No chief complaint on file.  Pain Assessment: Location:     Radiating:   Onset:   Duration:   Quality:   Severity:  /10 (subjective, self-reported pain score)  Effect on ADL:   Timing:   Modifying  factors:   BP:    HR:    Heather Curry comes in today for a follow-up visit after her initial evaluation on 04/14/2024. Today we went over the results of her tests. These were explained in Layman's terms. During today's appointment we went over my diagnostic impression, as well as the proposed  treatment plan.  Review of initial evaluation (04/14/2024): Heather Curry is a 58 year old female with chronic back pain who presents with continuous back pain and decreased functionality.   She experiences continuous back pain that has worsened over the last few years, particularly since her job became more sedentary due to COVID-19. The pain originates from an old injury and is present all day, every day, leading to decreased functionality.   The pain radiates from her upper spine into her right arm, causing her to brace it to use a mouse. She also experiences pain between her shoulder blades, radiating down to her lower back, hips, and the back of her thighs, with tingling sensations extending to the tops of her feet. Her legs feel unfamiliar, and she experiences tingling.   Her job is currently sedentary, requiring her to sit at a computer for extended periods.  Review of diagnostic test ordered on 04/14/2024:  Diagnostic lab work: *** Diagnostic imaging: ***  Discussed the use of AI scribe software for clinical note transcription with the patient, who gave verbal consent to proceed.  History of Present Illness          Patient presented with interventional treatment options. Heather Curry was informed that I will not be providing medication management. Pharmacotherapy evaluation including recommendations may be offered, if specifically requested.   Controlled Substance Pharmacotherapy Assessment REMS (Risk Evaluation and Mitigation Strategy)  Opioid Analgesic: None MME/day: 0 mg/day   Pill Count: None expected due to no prior prescriptions written by our practice. No notes on  file  Pharmacokinetics: Liberation and absorption (onset of action): WNL Distribution (time to peak effect): WNL Metabolism and excretion (duration of action): WNL         Pharmacodynamics: Desired effects: Analgesia: Heather Curry reports >50% benefit. Functional ability: Patient reports that medication allows her to accomplish basic ADLs Clinically meaningful improvement in function (CMIF): Sustained CMIF goals met Perceived effectiveness: Described as relatively effective, allowing for increase in activities of daily living (ADL) Undesirable effects: Side-effects or Adverse reactions: None reported Monitoring: [place] PMP: PDMP reviewed during this encounter. Online review of the past 32-month period previously conducted. Not applicable at this point since we have not taken over the patient's medication management yet. List of other Serum/Urine Drug Screening Test(s):  No results found for: AMPHSCRSER, BARBSCRSER, BENZOSCRSER, COCAINSCRSER, COCAINSCRNUR, PCPSCRSER, THCSCRSER, THCU, CANNABQUANT, OPIATESCRSER, OXYSCRSER, PROPOXSCRSER, ETH, CBDTHCR, D8THCCBX, D9THCCBX List of all UDS test(s) done:  Lab Results  Component Value Date   SUMMARY FINAL 04/14/2024   Last UDS on record: Summary  Date Value Ref Range Status  04/14/2024 FINAL  Final    Comment:    ==================================================================== Compliance Drug Analysis, Ur ==================================================================== Test                             Result       Flag       Units  Drug Present and Declared for Prescription Verification   Gabapentin                     PRESENT      EXPECTED   Bupropion                      PRESENT      EXPECTED   Hydroxybupropion               PRESENT  EXPECTED    Hydroxybupropion is an expected metabolite of bupropion.  Drug Present not Declared for Prescription Verification   Carboxy-THC                    155           UNEXPECTED ng/mg creat    Carboxy-THC is a metabolite of tetrahydrocannabinol (THC). Source of    THC is most commonly herbal marijuana or marijuana-based products,    but THC is also present in a scheduled prescription medication.    Trace amounts of THC can be present in hemp and cannabidiol (CBD)    products. This test is not intended to distinguish between delta-9-    tetrahydrocannabinol, the predominant form of THC in most herbal or    marijuana-based products, and delta-8-tetrahydrocannabinol.  Drug Absent but Declared for Prescription Verification   Tizanidine                      Not Detected UNEXPECTED    Tizanidine , as indicated in the declared medication list, is not    always detected even when used as directed.    Diclofenac                     Not Detected UNEXPECTED    Diclofenac, as indicated in the declared medication list, is not    always detected even when used as directed.    Salicylate                     Not Detected UNEXPECTED    Aspirin, as indicated in the declared medication list, is not always    detected even when used as directed.  ==================================================================== Test                      Result    Flag   Units      Ref Range   Creatinine              120              mg/dL      >=79 ==================================================================== Declared Medications:  The flagging and interpretation on this report are based on the  following declared medications.  Unexpected results may arise from  inaccuracies in the declared medications.   **Note: The testing scope of this panel includes these medications:   Bupropion (Wellbutrin XL)  Gabapentin (Neurontin)   **Note: The testing scope of this panel does not include small to  moderate amounts of these reported medications:   Aspirin  Diclofenac (Voltaren)  Tizanidine  (Zanaflex )   **Note: The testing scope of this panel does not include the   following reported medications:   Budesonide (Symbicort)  Formoterol (Symbicort)  Hydrochlorothiazide  (Hydrodiuril )  Levothyroxine  (Synthroid )  Meloxicam  (Mobic )  Montelukast  (Singulair )  Rosuvastatin (Crestor) ==================================================================== For clinical consultation, please call [phone]. ====================================================================    UDS interpretation: Unexpected findings: Undeclared illicit substance detected Medication Assessment Form: Not applicable. No opioids. Treatment compliance: Not applicable Risk Assessment Profile: Aberrant behavior: See initial evaluations. None observed or detected today Comorbid factors increasing risk of overdose: See initial evaluation. No additional risks detected today Opioid risk tool (ORT):     04/14/2024    1:26 PM  Opioid Risk   Alcohol 1  Illegal Drugs 0  Rx Drugs 0  Alcohol 0  Illegal Drugs 4  Rx Drugs 0  Age between 16-45 years  0  History of Preadolescent Sexual Abuse 0  Psychological Disease 0  Depression 1  Opioid Risk Tool Scoring 6  Opioid Risk Interpretation Moderate Risk    ORT Scoring interpretation table:  Score <3 = Low Risk for SUD  Score between 4-7 = Moderate Risk for SUD  Score >8 = High Risk for Opioid Abuse   Risk of substance use disorder (SUD): High  Risk Mitigation Strategies:  Patient opioid safety counseling: No controlled substances prescribed. Patient-Prescriber Agreement (PPA): No agreement signed.  Controlled substance notification to other providers: None required. No opioid therapy.  Pharmacologic Plan: Non-opioid analgesic therapy offered. Interventional alternatives discussed.             Laboratory Chemistry Profile   Renal Lab Results  Component Value Date   BUN 13 04/14/2024   CREATININE 0.95 04/14/2024   BCR 14 04/14/2024   GFRAA 100 05/24/2020   GFRNONAA 87 05/24/2020     Electrolytes Lab Results  Component  Value Date   NA 139 04/14/2024   K 4.2 04/14/2024   CL 100 04/14/2024   CALCIUM 10.6 (H) 04/14/2024   MG 2.2 04/14/2024     Hepatic Lab Results  Component Value Date   AST 22 04/14/2024   ALT 45 (H) 09/20/2023   ALBUMIN 4.6 04/14/2024   ALKPHOS 175 (H) 04/14/2024     ID Lab Results  Component Value Date   HIV Non Reactive 09/20/2023   SARSCOV2NAA NEGATIVE 05/30/2020     Bone Lab Results  Component Value Date   VD25OH 20.5 (L) 07/01/2023   25OHVITD1 32 04/14/2024   25OHVITD2 <1.0 04/14/2024   25OHVITD3 32 04/14/2024     Endocrine Lab Results  Component Value Date   GLUCOSE 78 04/14/2024   HGBA1C 5.4 07/01/2023   TSH 3.590 07/01/2023   FREET4 0.84 02/26/2023     Neuropathy Lab Results  Component Value Date   VITAMINB12 526 04/14/2024   HGBA1C 5.4 07/01/2023   HIV Non Reactive 09/20/2023     CNS No results found for: COLORCSF, APPEARCSF, RBCCOUNTCSF, WBCCSF, POLYSCSF, LYMPHSCSF, EOSCSF, PROTEINCSF, GLUCCSF, JCVIRUS, CSFOLI, IGGCSF, LABACHR, ACETBL   Inflammation (CRP: Acute  ESR: Chronic) Lab Results  Component Value Date   CRP <1 04/14/2024   ESRSEDRATE 13 04/14/2024     Rheumatology Lab Results  Component Value Date   ANA Negative 09/20/2023     Coagulation Lab Results  Component Value Date   PLT 288 07/01/2023     Cardiovascular Lab Results  Component Value Date   HGB 13.3 07/01/2023   HCT 40.7 07/01/2023     Screening Lab Results  Component Value Date   SARSCOV2NAA NEGATIVE 05/30/2020   HIV Non Reactive 09/20/2023     Cancer No results found for: CEA, CA125, LABCA2   Allergens No results found for: ALMOND, APPLE, ASPARAGUS, AVOCADO, BANANA, BARLEY, BASIL, BAYLEAF, GREENBEAN, LIMABEAN, WHITEBEAN, BEEFIGE, REDBEET, BLUEBERRY, BROCCOLI, CABBAGE, MELON, CARROT, CASEIN, CASHEWNUT, CAULIFLOWER, CELERY     Note: Lab results reviewed.  Recent Diagnostic  Imaging Review  Cervical Imaging: Cervical MR wo contrast: Results for orders placed during the hospital encounter of 08/29/23 MR CERVICAL SPINE WO CONTRAST  Narrative CLINICAL DATA:  Cervical radiculopathy  EXAM: MRI CERVICAL SPINE WITHOUT CONTRAST  TECHNIQUE: Multiplanar, multisequence MR imaging of the cervical spine was performed. No intravenous contrast was administered.  COMPARISON:  None Available.  FINDINGS: Alignment: No significant listhesis.  Vertebrae: No acute fracture, evidence of discitis, or suspicious osseous lesion.  Cord: Normal signal and morphology.  Posterior Fossa, vertebral arteries, paraspinal tissues: Negative.  Disc levels:  C2-C3: No significant disc bulge. Left facet arthropathy. No spinal canal stenosis or neural foraminal narrowing.  C3-C4: Mild disc bulge. Left facet and uncovertebral hypertrophy. No spinal canal stenosis. Mild left neural foraminal narrowing.  C4-C5: Left foraminal protrusion. Left facet and uncovertebral hypertrophy. No spinal canal stenosis. Severe left neural foraminal narrowing.  C5-C6: Disc height loss and disc osteophyte complex with central protrusion, which indents the ventral thecal sac and causes minimal spinal cord deformation. Facet and uncovertebral hypertrophy. Mild spinal canal stenosis. Mild right neural foraminal narrowing.  C6-C7: No significant disc bulge. No spinal canal stenosis or neuroforaminal narrowing.  C7-T1: No significant disc bulge. No spinal canal stenosis or neuroforaminal narrowing.  IMPRESSION: 1. C5-C6 mild spinal canal stenosis and mild right neural foraminal narrowing. 2. C4-C5 severe left neural foraminal narrowing. 3. C3-C4 mild left neural foraminal narrowing.   Electronically Signed By: Donald Campion M.D. On: 09/12/2023 01:19  Cervical DG Bending/F/E views: Results for orders placed during the hospital encounter of 04/18/24 DG Cervical Spine With Flex &  Extend  Narrative CLINICAL DATA:  Provided history: Cervicalgia. Chronic neck pain. Abnormal MRI, cervical spine. Chronic pain of both shoulders. Chronic pain of right upper extremity. Cervical radiculopathy at C7. Cervical radiculopathy at C8. Right cervical radiculopathy. Cervical disc disorder with radiculopathy, cervicothoracic region. Radicular pain of shoulder. Pain of cervical facet joint.  EXAM: CERVICAL SPINE COMPLETE WITH FLEXION AND EXTENSION VIEWS  COMPARISON:  Cervical spine MRI 08/29/2023  FINDINGS: The alignment is normal. No abnormal motion on flexion or extension. Disc space narrowing at C5-C6. Additional disc bulge on prior MRI not well demonstrated by modality. Mild multilevel facet hypertrophy, greatest at C4-C5 on the left. There is left bony neural foraminal stenosis at C4-C5. No evidence of fracture, suspicious bone lesion or bone destruction. No prevertebral soft tissue thickening.  IMPRESSION: 1. Degenerative disc disease at C5-C6. 2. Mild multilevel facet hypertrophy, greatest at C4-C5 on the left. Left bony neural foraminal stenosis at C4-C5.   Electronically Signed By: Andrea Gasman M.D. On: 04/26/2024 12:40  Shoulder Imaging: Shoulder-R DG: Results for orders placed during the hospital encounter of 04/18/24 DG Shoulder Right  Narrative CLINICAL DATA:  Chronic pain of both shoulders. Chronic pain of right upper extremity.  EXAM: DG SHOULDER 2+V*R*  COMPARISON:  None Available.  FINDINGS: Moderate glenohumeral joint space narrowing and inferior spurring of the glenoid and humeral head. Mild degenerative spurring of the acromioclavicular joint. Scattered bone islands in the proximal humerus. No fracture, erosive change or evidence of suspicious bone lesion. No soft tissue calcifications. 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   Electronically  Signed By: Andrea Gasman M.D. On: 04/26/2024 12:34  Shoulder-L DG: Results for orders placed during the hospital encounter of 04/18/24 DG Shoulder Left  Narrative CLINICAL DATA:  Chronic bilateral shoulder pain.  EXAM: LEFT SHOULDER - 2+ VIEW  COMPARISON:  None Available.  FINDINGS: There is no evidence of fracture or dislocation. Minor acromioclavicular spurring. The glenohumeral joint is preserved. No erosive change or evidence of focal bone abnormality. No soft tissue calcifications. No abnormalities in the left hemithorax.  IMPRESSION: Minor acromioclavicular spurring.   Electronically Signed By: Andrea Gasman M.D. On: 04/26/2024 12:34  Thoracic Imaging: Thoracic DG w/swimmers view: Results for orders placed during the hospital encounter of 04/18/24 [facility] Thoracic Spine W/Swimmers  Narrative CLINICAL DATA:  Upper/thoracic back pain. Chronic upper back  pain. Chronic bilateral thoracic back pain.  EXAM: THORACIC SPINE - 3 VIEWS  COMPARISON:  None Available.  FINDINGS: Twelve rib-bearing thoracic vertebra. Slight straightening of normal thoracic kyphosis. Levo scoliotic curvature at the thoracolumbar junction. No evidence of fracture or compression deformity. Mild disc space narrowing and spurring at T11-T12. Anterior spurring at T12-L1. The disc spaces are preserved. No evidence of focal bone lesion or bone destruction. No paravertebral soft tissue abnormality.  IMPRESSION: 1. Levoscoliotic curvature at the thoracolumbar junction. 2. Degenerative disc disease T11-T12.  Anterior spurring at T12-L1.   Electronically Signed By: Andrea Gasman M.D. On: 04/26/2024 12:43  Lumbosacral Imaging: Lumbar MR wo contrast: Results for orders placed during the hospital encounter of 05/04/23 MR LUMBAR SPINE WO CONTRAST  Narrative CLINICAL DATA:  Radiating pain from the back down both legs to the feet.  EXAM: MRI LUMBAR SPINE WITHOUT  CONTRAST  TECHNIQUE: Multiplanar, multisequence MR imaging of the lumbar spine was performed. No intravenous contrast was administered.  COMPARISON:  None Available.  FINDINGS: Segmentation:  Standard.  Alignment:  Physiologic.  Levocurvature of the thoracolumbar spine.  Vertebrae: No acute fracture, evidence of discitis, or aggressive bone lesion.  Conus medullaris and cauda equina: Conus extends to the L1-2 level. Conus and cauda equina appear normal.  Paraspinal and other soft tissues: No acute paraspinal abnormality.  Disc levels:  Disc spaces: Degenerative disease with disc height loss at T11-12, T12-L1. Disc desiccation throughout the lumbar spine.  T12-L1: Small right paracentral disc protrusion. Mild bilateral facet arthropathy. No foraminal or central canal stenosis.  L1-L2: Mild broad-based disc bulge. Mild bilateral facet arthropathy. No foraminal or central canal stenosis.  L2-L3: Mild broad-based disc bulge with a broad left foraminal/lateral disc protrusion. Moderate bilateral facet arthropathy. Mild spinal stenosis. Bilateral lateral recess stenosis. No foraminal stenosis.  L3-L4: Mild broad-based disc bulge. Moderate bilateral facet arthropathy. Mild spinal stenosis. Bilateral lateral recess stenosis. No foraminal stenosis.  L4-L5: Mild broad-based disc bulge. Moderate bilateral facet arthropathy. No spinal stenosis. No foraminal stenosis.  L5-S1: Mild broad-based disc bulge. Mild bilateral facet arthropathy. No foraminal or central canal stenosis.  IMPRESSION: 1. Lumbar spine spondylosis as described above. 2. No acute osseous injury of the lumbar spine.   Electronically Signed By: Julaine Blanch M.D. On: 05/04/2023 16:39  Lumbar DG Bending views: Results for orders placed during the hospital encounter of 04/18/24 DG Lumbar Spine Complete W/Bend  Narrative CLINICAL DATA:  Provided history: Low back pain. Chronic bilateral low back pain with  bilateral sciatica. Chronic bilateral low back pain without sciatica. Chronic hip pain, bilateral. Chronic pain of lower extremity, bilateral. Chronic radicular pain of lower extremity. Lumbar facet joint pain. Lumbar radiculitis. Lumbosacral radiculopathy at L5. Lumbosacral radiculopathy at S1.  EXAM: LUMBAR SPINE - COMPLETE WITH BENDING VIEWS  COMPARISON:  Lumbar MRI 05/04/2023  FINDINGS: Five non-rib-bearing lumbar vertebra. Levo scoliotic curvature centered at the thoracolumbar junction, approximately 23 degrees when measured from superior endplate of T11 to inferior endplate of L3. No listhesis. No abnormal motion on flexion or extension. There is slightly limited range of motion. Normal vertebral body heights. No fracture or compression deformity. Mild diffuse disc space narrowing and anterior spurring. There is facet hypertrophy from L2-L3 through L5-S1. No evidence of pars defects or focal bone abnormality.  IMPRESSION: 1. Levo scoliotic curvature centered at the thoracolumbar junction. 2. Mild diffuse degenerative disc disease. 3. Facet hypertrophy from L2-L3 through L5-S1.   Electronically Signed By: Andrea Gasman M.D. On: 04/26/2024 12:44  Hip  Imaging: Hip-B DG Bilateral (5V): Results for orders placed during the hospital encounter of 04/18/24 DG HIPS BILAT W OR W/O PELVIS MIN 5 VIEWS  Narrative CLINICAL DATA:  Chronic bilateral hip pain. Chronic pain of both lower extremities.  EXAM: DG HIP (WITH OR WITHOUT PELVIS) 5+V BILAT  COMPARISON:  None Available.  FINDINGS: Mild bilateral hip joint space narrowing. Normal acetabular and femoral morphology. No evidence of fracture, erosion, or avascular necrosis. No suspicious bone lesion or bone destruction. Pubic symphysis and sacroiliac joints are normal. Occasional pelvic phleboliths.  IMPRESSION: Mild bilateral hip joint space narrowing.   Electronically Signed By: Andrea Gasman M.D. On:  04/26/2024 12:35  Complexity Note: Imaging results reviewed.                         Meds   Current Outpatient Medications:    aspirin EC 81 MG tablet, Take 81 mg by mouth daily. Swallow whole., Disp: , Rfl:    budesonide-formoterol (SYMBICORT) 160-4.5 MCG/ACT inhaler, Inhale into the lungs., Disp: , Rfl:    buPROPion (WELLBUTRIN XL) 150 MG 24 hr tablet, Take 150 mg by mouth daily., Disp: , Rfl:    diclofenac Sodium (VOLTAREN) 1 % GEL, Apply topically., Disp: , Rfl:    gabapentin (NEURONTIN) 300 MG capsule, Take 1 capsule by mouth 4 (four) times daily., Disp: , Rfl:    hydrochlorothiazide  (HYDRODIURIL ) 25 MG tablet, Take 1 tablet (25 mg total) by mouth daily., Disp: 90 tablet, Rfl: 3   levothyroxine  (SYNTHROID ) 75 MCG tablet, TAKE 1 TABLET BY MOUTH EVERY DAY, Disp: 90 tablet, Rfl: 0   meloxicam  (MOBIC ) 15 MG tablet, TAKE 1 TABLET (15 MG TOTAL) BY MOUTH DAILY., Disp: 30 tablet, Rfl: 0   montelukast  (SINGULAIR ) 10 MG tablet, Take 1 tablet (10 mg total) by mouth at bedtime., Disp: 90 tablet, Rfl: 1   rosuvastatin (CRESTOR) 20 MG tablet, Take 20 mg by mouth., Disp: , Rfl:    tiZANidine  (ZANAFLEX ) 4 MG tablet, TAKE 1 TABLET BY MOUTH EVERY 6 HOURS AS NEEDED FOR MUSCLE SPASMS., Disp: 30 tablet, Rfl: 0  ROS  Constitutional: Denies any fever or chills Gastrointestinal: No reported hemesis, hematochezia, vomiting, or acute GI distress Musculoskeletal: Denies any acute onset joint swelling, redness, loss of ROM, or weakness Neurological: No reported episodes of acute onset apraxia, aphasia, dysarthria, agnosia, amnesia, paralysis, loss of coordination, or loss of consciousness  Allergies  Heather Curry is allergic to bee venom and penicillins.  PFSH  Drug: Heather Curry  reports no history of drug use. Alcohol:  reports current alcohol use. Tobacco:  reports that she quit smoking about 16 years ago. Her smoking use included cigarettes. She has never used smokeless tobacco. Medical:  has a past  medical history of Colon polyps and Hypertension. Surgical: Heather Curry  has a past surgical history that includes Breast surgery (Left); Appendectomy; Cervix removal; Colonoscopy with propofol  (N/A, 04/18/2018); Colonoscopy with propofol  (N/A, 04/17/2019); Colonoscopy with propofol  (N/A, 06/01/2020); Breast biopsy (Right, 2007); Breast biopsy (Right, 09/27/2020); and Colonoscopy with propofol  (N/A, 06/01/2022). Family: family history includes Alcohol abuse in her father; Arthritis in her maternal grandmother and mother; Breast cancer (age of onset: 26) in her maternal aunt; COPD in her father; Cancer in her maternal grandmother; Congestive Heart Failure in her mother; Hypertension in her father, mother, and sister.  Constitutional Exam  General appearance: Well nourished, well developed, and well hydrated. In no apparent acute distress There were no vitals filed for this  visit. BMI Assessment: Estimated body mass index is 30.55 kg/m as calculated from the following:   Height as of 04/14/24: 5' 4 (1.626 m).   Weight as of 04/14/24: 178 lb (80.7 kg).  BMI interpretation table: BMI level Category Range association with higher incidence of chronic pain  <18 kg/m2 Underweight   18.5-24.9 kg/m2 Ideal body weight   25-29.9 kg/m2 Overweight Increased incidence by 20%  30-34.9 kg/m2 Obese (Class I) Increased incidence by 68%  35-39.9 kg/m2 Severe obesity (Class II) Increased incidence by 136%  >40 kg/m2 Extreme obesity (Class III) Increased incidence by 254%   Patient's current BMI Ideal Body weight  There is no height or weight on file to calculate BMI. Patient weight not recorded   BMI Readings from Last 4 Encounters:  04/14/24 30.55 kg/m  11/13/23 30.73 kg/m  09/20/23 31.27 kg/m  07/01/23 29.51 kg/m   Wt Readings from Last 4 Encounters:  04/14/24 178 lb (80.7 kg)  11/13/23 179 lb (81.2 kg)  09/20/23 182 lb 3.2 oz (82.6 kg)  07/01/23 171 lb 14.4 oz (78 kg)    Psych/Mental status:  Alert, oriented x 3 (person, place, & time)       Eyes: PERLA Respiratory: No evidence of acute respiratory distress  Assessment & Plan  Primary Diagnosis & Pertinent Problem List: The primary encounter diagnosis was Chronic neck and back pain (1ry area of Pain). 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 Visit Diagnosis: 1. Chronic neck and back pain (1ry area of Pain)   2. Chronic shoulder pain (2ry area of Pain) (Bilateral)   3. Chronic upper extremity pain (3ry area of Pain) (Right)   4. 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 Chronic lower extremity pain (5th area of Pain) (Bilateral)    Problems updated and reviewed during this visit: No problems updated.  Plan of Care  Assessment and Plan             Pharmacotherapy (Medications Ordered): No orders of the defined types were placed in this encounter.  Procedure Orders    No procedure(s) ordered today   No orders of the defined types were placed in this encounter.  Lab Orders  No laboratory test(s) ordered today   Imaging Orders  No imaging studies ordered today   Referral Orders  No referral(s) requested today    Pharmacological management:  Opioid Analgesics: I will not be prescribing any opioids at this time Membrane stabilizer: I will not be prescribing any at this time Muscle relaxant: I will not be prescribing any at this time NSAID: I will not be prescribing any at this time Other analgesic(s): I will not be prescribing any at this time      Interventional Therapies  Risk Factors  Considerations  Medical Comorbidities:     Planned  Pending:      Under consideration:   Pending   Completed:  (Analgesic benefit)1  None at this time   Therapeutic  Palliative (PRN) options:   None established   Completed by other providers:   None reported  1(Analgesic benefit): Expressed in percentage (%). (Local anesthetic[LA] +/- sedation  L.A.Local Anesthetic  Steroid benefit  Ongoing benefit)      Provider-requested follow-up: No  follow-ups on file. Recent Visits Date Type Provider Dept  04/14/24 Office Visit Tanya Glisson, MD [facility]  Showing recent visits within past 90 days and meeting all other requirements Future Appointments Date Type Provider Dept  05/11/24 Appointment Tanya Glisson, MD [facility]  Showing future appointments within next 90 days and meeting all other requirements   Primary Care Physician: Heather Kelly DASEN, FNP (Inactive)  Duration of encounter: *** minutes.  Total time on encounter, as per AMA guidelines included both the face-to-face and non-face-to-face time personally spent by the physician and/or other qualified health care professional(s) on the day of the encounter (includes time in activities that require the physician or other qualified health care professional and does not include time in activities normally performed by clinical staff). Physician's time may include the following activities when performed: Preparing to see the patient (e.g., pre-charting review of records, searching for previously ordered imaging, lab work, and nerve conduction tests) Review of prior analgesic pharmacotherapies. Reviewing PMP Interpreting ordered tests (e.g., lab work, imaging, nerve conduction tests) Performing post-procedure evaluations, including interpretation of diagnostic procedures Obtaining and/or reviewing separately obtained history Performing a medically appropriate examination and/or evaluation Counseling and educating the patient/family/caregiver Ordering medications, tests, or procedures Referring and communicating  with other health care professionals (when not separately reported) Documenting clinical information in the electronic or other health record Independently interpreting results (not separately reported) and communicating results to the patient/ family/caregiver Care coordination (not separately reported)  Note by: Glisson DELENA Tanya, MD (TTS technology used. I apologize for any typographical errors that were not detected and corrected.) Date: 05/11/2024; Time: 9:15 AM

## 2024-05-11 ENCOUNTER — Encounter: Payer: Self-pay | Admitting: Pain Medicine

## 2024-05-11 ENCOUNTER — Ambulatory Visit: Attending: Pain Medicine | Admitting: Pain Medicine

## 2024-05-11 VITALS — BP 96/67 | HR 62 | Resp 18 | Ht 64.0 in | Wt 175.0 lb

## 2024-05-11 DIAGNOSIS — R937 Abnormal findings on diagnostic imaging of other parts of musculoskeletal system: Secondary | ICD-10-CM | POA: Insufficient documentation

## 2024-05-11 DIAGNOSIS — G8929 Other chronic pain: Secondary | ICD-10-CM | POA: Insufficient documentation

## 2024-05-11 DIAGNOSIS — M19012 Primary osteoarthritis, left shoulder: Secondary | ICD-10-CM | POA: Insufficient documentation

## 2024-05-11 DIAGNOSIS — M51369 Other intervertebral disc degeneration, lumbar region without mention of lumbar back pain or lower extremity pain: Secondary | ICD-10-CM | POA: Diagnosis present

## 2024-05-11 DIAGNOSIS — M79601 Pain in right arm: Secondary | ICD-10-CM | POA: Diagnosis present

## 2024-05-11 DIAGNOSIS — M25511 Pain in right shoulder: Secondary | ICD-10-CM | POA: Diagnosis present

## 2024-05-11 DIAGNOSIS — M19011 Primary osteoarthritis, right shoulder: Secondary | ICD-10-CM | POA: Insufficient documentation

## 2024-05-11 DIAGNOSIS — M542 Cervicalgia: Secondary | ICD-10-CM | POA: Diagnosis present

## 2024-05-11 DIAGNOSIS — M4722 Other spondylosis with radiculopathy, cervical region: Secondary | ICD-10-CM | POA: Diagnosis not present

## 2024-05-11 DIAGNOSIS — M47816 Spondylosis without myelopathy or radiculopathy, lumbar region: Secondary | ICD-10-CM | POA: Diagnosis present

## 2024-05-11 DIAGNOSIS — M79605 Pain in left leg: Secondary | ICD-10-CM | POA: Insufficient documentation

## 2024-05-11 DIAGNOSIS — M5013 Cervical disc disorder with radiculopathy, cervicothoracic region: Secondary | ICD-10-CM | POA: Insufficient documentation

## 2024-05-11 DIAGNOSIS — M5134 Other intervertebral disc degeneration, thoracic region: Secondary | ICD-10-CM | POA: Diagnosis present

## 2024-05-11 DIAGNOSIS — M79604 Pain in right leg: Secondary | ICD-10-CM | POA: Diagnosis present

## 2024-05-11 DIAGNOSIS — M5441 Lumbago with sciatica, right side: Secondary | ICD-10-CM | POA: Diagnosis present

## 2024-05-11 DIAGNOSIS — M549 Dorsalgia, unspecified: Secondary | ICD-10-CM | POA: Diagnosis present

## 2024-05-11 DIAGNOSIS — R892 Abnormal level of other drugs, medicaments and biological substances in specimens from other organs, systems and tissues: Secondary | ICD-10-CM | POA: Insufficient documentation

## 2024-05-11 DIAGNOSIS — M5030 Other cervical disc degeneration, unspecified cervical region: Secondary | ICD-10-CM | POA: Diagnosis not present

## 2024-05-11 DIAGNOSIS — M4185 Other forms of scoliosis, thoracolumbar region: Secondary | ICD-10-CM | POA: Insufficient documentation

## 2024-05-11 DIAGNOSIS — M47812 Spondylosis without myelopathy or radiculopathy, cervical region: Secondary | ICD-10-CM | POA: Diagnosis present

## 2024-05-11 DIAGNOSIS — M5442 Lumbago with sciatica, left side: Secondary | ICD-10-CM | POA: Diagnosis present

## 2024-05-11 DIAGNOSIS — M25512 Pain in left shoulder: Secondary | ICD-10-CM | POA: Diagnosis present

## 2024-05-11 DIAGNOSIS — M160 Bilateral primary osteoarthritis of hip: Secondary | ICD-10-CM | POA: Insufficient documentation

## 2024-05-11 DIAGNOSIS — M4802 Spinal stenosis, cervical region: Secondary | ICD-10-CM | POA: Insufficient documentation

## 2024-05-11 DIAGNOSIS — M5412 Radiculopathy, cervical region: Secondary | ICD-10-CM | POA: Insufficient documentation

## 2024-05-11 NOTE — Patient Instructions (Addendum)
 Preparing for Procedure with Sedation Instructions: Oral Intake: Do not eat or drink anything for at least 8 hours prior to your procedure. Transportation: Public transportation is not allowed. Bring an adult driver. The driver must be physically present in our waiting room before any procedure can be started. Physical Assistance: Bring an adult capable of physically assisting you, in the event you need help. Blood Pressure Medicine: Take your blood pressure medicine with a sip of water the morning of the procedure. Insulin: Take only  of your normal insulin dose. Preventing infections: Shower with an antibacterial soap the morning of your procedure. Build-up your immune system: Take 1000 mg of Vitamin C with every meal (3 times a day) the day prior to your procedure. Pregnancy: If you are pregnant, call and cancel the procedure. Sickness: If you have a cold, fever, or any active infections, call and cancel the procedure. Arrival: You must be in the facility at least 30 minutes prior to your scheduled procedure. Children: Do not bring children with you. Dress appropriately: Bring dark clothing that you would not mind if they get stained. Valuables: Do not bring any jewelry or valuables. Procedure appointments are reserved for interventional treatments only. No Prescription Refills. No medication changes will be discussed during procedure appointments. No disability issues will be discussed. Epidural Steroid Injection Patient Information  Description: The epidural space surrounds the nerves as they exit the spinal cord.  In some patients, the nerves can be compressed and inflamed by a bulging disc or a tight spinal canal (spinal stenosis).  By injecting steroids into the epidural space, we can bring irritated nerves into direct contact with a potentially helpful medication.  These steroids act directly on the irritated nerves and can reduce swelling and inflammation which often leads to  decreased pain.  Epidural steroids may be injected anywhere along the spine and from the neck to the low back depending upon the location of your pain.   After numbing the skin with local anesthetic (like Novocaine), a small needle is passed into the epidural space slowly.  You may experience a sensation of pressure while this is being done.  The entire block usually last less than 10 minutes.  Conditions which may be treated by epidural steroids:  Low back and leg pain Neck and arm pain Spinal stenosis Post-laminectomy syndrome Herpes zoster (shingles) pain Pain from compression fractures  Preparation for the injection:  Do not eat any solid food or dairy products within 8 hours of your appointment.  You may drink clear liquids up to 3 hours before appointment.  Clear liquids include water, black coffee, juice or soda.  No milk or cream please. You may take your regular medication, including pain medications, with a sip of water before your appointment  Diabetics should hold regular insulin (if taken separately) and take 1/2 normal NPH dos the morning of the procedure.  Carry some sugar containing items with you to your appointment. A driver must accompany you and be prepared to drive you home after your procedure.  Bring all your current medications with your. An IV may be inserted and sedation may be given at the discretion of the physician.   A blood pressure cuff, EKG and other monitors will often be applied during the procedure.  Some patients may need to have extra oxygen administered for a short period. You will be asked to provide medical information, including your allergies, prior to the procedure.  We must know immediately if you are taking blood  thinners (like Coumadin/Warfarin)  Or if you are allergic to IV iodine contrast (dye). We must know if you could possible be pregnant.  Possible side-effects: Bleeding from needle site Infection (rare, may require surgery) Nerve injury  (rare) Numbness & tingling (temporary) Difficulty urinating (rare, temporary) Spinal headache ( a headache worse with upright posture) Light -headedness (temporary) Pain at injection site (several days) Decreased blood pressure (temporary) Weakness in arm/leg (temporary) Pressure sensation in back/neck (temporary)  Call if you experience: Fever/chills associated with headache or increased back/neck pain. Headache worsened by an upright position. New onset weakness or numbness of an extremity below the injection site Hives or difficulty breathing (go to the emergency room) Inflammation or drainage at the infection site Severe back/neck pain Any new symptoms which are concerning to you  Please note:  Although the local anesthetic injected can often make your back or neck feel good for several hours after the injection, the pain will likely return.  It takes 3-7 days for steroids to work in the epidural space.  You may not notice any pain relief for at least that one week.  If effective, we will often do a series of three injections spaced 3-6 weeks apart to maximally decrease your pain.  After the initial series, we generally will wait several months before considering a repeat injection of the same type.  If you have any questions, please call [phone] [facility] ______________________________________________________________________    Appointment Information  It is our goal and responsibility to provide the medical community with assistance in the evaluation and management of patients with chronic pain. Unfortunately our resources are limited. Because we do not have an unlimited amount of time, or available appointments, we are required to closely monitor for unkept or cancelled appointments.  Patient's responsibilities: 1. Punctuality: Patients are required to be physically present in our office at least 15 minutes before their scheduled  appointment. 2. Tardiness: Patients not physically present in our office at their scheduled appointment time will be rescheduled. 3. Plan ahead: Assume that you will encounter traffic and plan to arrive 30 minutes before your appointment. 4. Other appointments and responsibilities: Do not schedule other appointments immediately before or after your scheduled appointment.  5. Be prepared: Make a list of everything that you need to discuss with your provider so that you use your time efficiently. Once the provider leaves your room, he/she will not return to your room to discuss anything that you neglected to bring up during your allowed time. 6. No children or pets: Do not bring children or pets to your appointment. 7. Cancelling or rescheduling your appointment: Advanced notification (more than 24 hours in advance) is required. 8. No Show: Not calling to cancel an appointment and simply not showing up is unacceptable. This leads to loss of appointments that could have been used by a patient in need. (See below)  Corrective process for repeat offenders:  No Shows: Three (3) No Shows within a 12 month period will result in an automatic discharge from our practice. Rescheduling or cancelling with more than 24 hours notice will not be penalized and will not count against you. Tardiness: If you have to be rescheduled three (3) times due to late arrivals, it will be counted as one (1) No Show. Cancellation or reschedule: Three (3) cancellations or rescheduling where notice was given with less than 24 hours in advance, will be recorded as one (1) No Show.  Types of appointments: New patient  initial evaluation: These are evaluations only. Your initial patient questionnaire will be collected and entered into the system. A history of present illness will be taken. Prior lab work, imaging studies, and associated treatments will be reviewed. The provider may order appropriate diagnostic testing  depending on their evaluation and review of available information. No treatments will be started on this visit. 2nd Follow-up visit: During this visit your provider will inform you of the results of the diagnostic tests ordered on the initial evaluation. Based on the providers assessment, treatment options will be offered, at which the patient will decide if he/she is interested in the alternatives. If interested, a treatment plan will be established and started. Procedure visits: Post-procedure evaluation visits: Evaluation visits MM New problems Flare-up evaluations Follow-up after diagnostic testing ______________________________________________________________________     ______________________________________________________________________    Procedure instructions  Stop blood-thinners  Do not eat or drink fluids (other than water) for 6 hours before your procedure  No water for 2 hours before your procedure  Take your blood pressure medicine with a sip of water  Arrive 30 minutes before your appointment  If sedation is planned, bring suitable driver. [place], [place], & public transportation are NOT APPROVED)  Carefully read the Preparing for your procedure detailed instructions  If you have questions call us  at [phone]  Procedure appointments are for procedures only.   NO medication refills or new problem evaluations will be done on procedure days.   Only the scheduled, pre-approved procedure and side will be done.   ______________________________________________________________________     ______________________________________________________________________    Preparing for your procedure  Appointments: If you think you may not be able to keep your appointment, call 24-48 hours in advance to cancel. We need time to make it available to others.  Procedure visits are for procedures only. During your procedure appointment there will be: NO Prescription  Refills*. NO medication changes or discussions*. NO discussion of disability issues*. NO unrelated pain problem evaluations*. NO evaluations to order other pain procedures*. *These will be addressed at a separate and distinct evaluation encounter on the provider's evaluation schedule and not during procedure days.  Instructions: Food intake: Avoid eating anything solid for at least 8 hours prior to your procedure. Clear liquid intake: You may take clear liquids such as water up to 2 hours prior to your procedure. (No carbonated drinks. No soda.) Transportation: Unless otherwise stated by your physician, bring a driver. (Driver cannot be a Market researcher, Pharmacist, community, or any other form of public transportation.) Morning Medicines: Except for blood thinners, take all of your other morning medications with a sip of water. Make sure to take your heart and blood pressure medicines. If your blood pressure's lower number is above 100, the case will be rescheduled. Blood thinners: Make sure to stop your blood thinners as instructed.  If you take a blood thinner, but were not instructed to stop it, call our office [phone] and ask to talk to a nurse. Not stopping a blood thinner prior to certain procedures could lead to serious complications. Diabetics on insulin: Notify the staff so that you can be scheduled 1st case in the morning. If your diabetes requires high dose insulin, take only  of your normal insulin dose the morning of the procedure and notify the staff that you have done so. Preventing infections: Shower with an antibacterial soap the morning of your procedure.  Build-up your immune system: Take 1000 mg of Vitamin C with every meal (3 times a day)  the day prior to your procedure. Antibiotics: Inform the nursing staff if you are taking any antibiotics or if you have any conditions that may require antibiotics prior to procedures. (Example: recent joint implants)   Pregnancy: If you are pregnant make sure  to notify the nursing staff. Not doing so may result in injury to the fetus, including death.  Sickness: If you have a cold, fever, or any active infections, call and cancel or reschedule your procedure. Receiving steroids while having an infection may result in complications. Arrival: You must be in the facility at least 30 minutes prior to your scheduled procedure. Tardiness: Your scheduled time is also the cutoff time. If you do not arrive at least 15 minutes prior to your procedure, you will be rescheduled.  Children: Do not bring any children with you. Make arrangements to keep them home. Dress appropriately: There is always a possibility that your clothing may get soiled. Avoid long dresses. Valuables: Do not bring any jewelry or valuables.  Reasons to call and reschedule or cancel your procedure: (Following these recommendations will minimize the risk of a serious complication.) Surgeries: Avoid having procedures within 2 weeks of any surgery. (Avoid for 2 weeks before or after any surgery). Flu Shots: Avoid having procedures within 2 weeks of a flu shots or . (Avoid for 2 weeks before or after immunizations). Barium: Avoid having a procedure within 7-10 days after having had a radiological study involving the use of radiological contrast. (Myelograms, Barium swallow or enema study). Heart attacks: Avoid any elective procedures or surgeries for the initial 6 months after a Myocardial Infarction (Heart Attack). Blood thinners: It is imperative that you stop these medications before procedures. Let us  know if you if you take any blood thinner.  Infection: Avoid procedures during or within two weeks of an infection (including chest colds or gastrointestinal problems). Symptoms associated with infections include: Localized redness, fever, chills, night sweats or profuse sweating, burning sensation when voiding, cough, congestion, stuffiness, runny nose, sore throat, diarrhea, nausea, vomiting,  cold or Flu symptoms, recent or current infections. It is specially important if the infection is over the area that we intend to treat. Heart and lung problems: Symptoms that may suggest an active cardiopulmonary problem include: cough, chest pain, breathing difficulties or shortness of breath, dizziness, ankle swelling, uncontrolled high or unusually low blood pressure, and/or palpitations. If you are experiencing any of these symptoms, cancel your procedure and contact your primary care physician for an evaluation.  Remember:  Regular Business hours are:  Monday to Thursday 8:00 AM to 4:00 PM  Provider's Schedule: Eric Como, MD:  Procedure days: Tuesday and Thursday 7:30 AM to 4:00 PM  Wallie Sherry, MD:  Procedure days: Monday and Wednesday 7:30 AM to 4:00 PM Last  Updated: 08/27/2023 ______________________________________________________________________     ______________________________________________________________________    General Risks and Possible Complications  Patient Responsibilities: It is important that you read this as it is part of your informed consent. It is our duty to inform you of the risks and possible complications associated with treatments offered to you. It is your responsibility as a patient to read this and to ask questions about anything that is not clear or that you believe was not covered in this document.  Patient's Rights: You have the right to refuse treatment. You also have the right to change your mind, even after initially having agreed to have the treatment done. However, under this last option, if you wait until the last second  to change your mind, you may be charged for the materials used up to that point.  Introduction: Medicine is not an Visual merchandiser. Everything in Medicine, including the lack of treatment(s), carries the potential for danger, harm, or loss (which is by definition: Risk). In Medicine, a complication is a secondary  problem, condition, or disease that can aggravate an already existing one. All treatments carry the risk of possible complications. The fact that a side effects or complications occurs, does not imply that the treatment was conducted incorrectly. It must be clearly understood that these can happen even when everything is done following the highest safety standards.  No treatment: You can choose not to proceed with the proposed treatment alternative. The "PRO(s)" would include: avoiding the risk of complications associated with the therapy. The "CON(s)" would include: not getting any of the treatment benefits. These benefits fall under one of three categories: diagnostic; therapeutic; and/or palliative. Diagnostic benefits include: getting information which can ultimately lead to improvement of the disease or symptom(s). Therapeutic benefits are those associated with the successful treatment of the disease. Finally, palliative benefits are those related to the decrease of the primary symptoms, without necessarily curing the condition (example: decreasing the pain from a flare-up of a chronic condition, such as incurable terminal cancer).  General Risks and Complications: These are associated to most interventional treatments. They can occur alone, or in combination. They fall under one of the following six (6) categories: no benefit or worsening of symptoms; bleeding; infection; nerve damage; allergic reactions; and/or death. No benefits or worsening of symptoms: In Medicine there are no guarantees, only probabilities. No healthcare provider can ever guarantee that a medical treatment will work, they can only state the probability that it may. Furthermore, there is always the possibility that the condition may worsen, either directly, or indirectly, as a consequence of the treatment. Bleeding: This is more common if the patient is taking a blood thinner, either prescription or over the counter (example: Goody  Powders, Fish oil, Aspirin, Garlic, etc.), or if suffering a condition associated with impaired coagulation (example: Hemophilia, cirrhosis of the liver, low platelet counts, etc.). However, even if you do not have one on these, it can still happen. If you have any of these conditions, or take one of these drugs, make sure to notify your treating physician. Infection: This is more common in patients with a compromised immune system, either due to disease (example: diabetes, cancer, human immunodeficiency virus [HIV], etc.), or due to medications or treatments (example: therapies used to treat cancer and rheumatological diseases). However, even if you do not have one on these, it can still happen. If you have any of these conditions, or take one of these drugs, make sure to notify your treating physician. Nerve Damage: This is more common when the treatment is an invasive one, but it can also happen with the use of medications, such as those used in the treatment of cancer. The damage can occur to small secondary nerves, or to large primary ones, such as those in the spinal cord and brain. This damage may be temporary or permanent and it may lead to impairments that can range from temporary numbness to permanent paralysis and/or brain death. Allergic Reactions: Any time a substance or material comes in contact with our body, there is the possibility of an allergic reaction. These can range from a mild skin rash (contact dermatitis) to a severe systemic reaction (anaphylactic reaction), which can result in death. Death: In general,  any medical intervention can result in death, most of the time due to an unforeseen complication. ______________________________________________________________________

## 2024-05-11 NOTE — Progress Notes (Signed)
 Safety precautions to be maintained throughout the outpatient stay will include: orient to surroundings, keep bed in low position, maintain call bell within reach at all times, provide assistance with transfer out of bed and ambulation.

## 2024-05-19 ENCOUNTER — Ambulatory Visit
Admission: RE | Admit: 2024-05-19 | Discharge: 2024-05-19 | Disposition: A | Discharge status: Home / Self Care | Source: Ambulatory Visit | Attending: Pain Medicine | Admitting: Pain Medicine

## 2024-05-19 ENCOUNTER — Encounter: Payer: Self-pay | Admitting: Pain Medicine

## 2024-05-19 ENCOUNTER — Ambulatory Visit (HOSPITAL_BASED_OUTPATIENT_CLINIC_OR_DEPARTMENT_OTHER): Admitting: Pain Medicine

## 2024-05-19 VITALS — BP 112/79 | HR 59 | Temp 97.6°F | Resp 16 | Ht 64.0 in | Wt 178.0 lb

## 2024-05-19 DIAGNOSIS — G8929 Other chronic pain: Secondary | ICD-10-CM | POA: Insufficient documentation

## 2024-05-19 DIAGNOSIS — R937 Abnormal findings on diagnostic imaging of other parts of musculoskeletal system: Secondary | ICD-10-CM | POA: Insufficient documentation

## 2024-05-19 DIAGNOSIS — M5412 Radiculopathy, cervical region: Secondary | ICD-10-CM | POA: Diagnosis present

## 2024-05-19 DIAGNOSIS — M47812 Spondylosis without myelopathy or radiculopathy, cervical region: Secondary | ICD-10-CM | POA: Diagnosis present

## 2024-05-19 DIAGNOSIS — M542 Cervicalgia: Secondary | ICD-10-CM | POA: Insufficient documentation

## 2024-05-19 DIAGNOSIS — M5013 Cervical disc disorder with radiculopathy, cervicothoracic region: Secondary | ICD-10-CM | POA: Insufficient documentation

## 2024-05-19 DIAGNOSIS — M4802 Spinal stenosis, cervical region: Secondary | ICD-10-CM | POA: Diagnosis present

## 2024-05-19 DIAGNOSIS — M25512 Pain in left shoulder: Secondary | ICD-10-CM | POA: Insufficient documentation

## 2024-05-19 DIAGNOSIS — M25511 Pain in right shoulder: Secondary | ICD-10-CM | POA: Diagnosis present

## 2024-05-19 DIAGNOSIS — M79601 Pain in right arm: Secondary | ICD-10-CM | POA: Insufficient documentation

## 2024-05-19 DIAGNOSIS — M5030 Other cervical disc degeneration, unspecified cervical region: Secondary | ICD-10-CM | POA: Insufficient documentation

## 2024-05-19 MED ORDER — LIDOCAINE HCL 2 % IJ SOLN
20.0000 mL | Freq: Once | INTRAMUSCULAR | Status: AC
  Administered 2024-05-19: 400 mg

## 2024-05-19 MED ORDER — LIDOCAINE HCL 2 % IJ SOLN
INTRAMUSCULAR | Status: AC
  Filled 2024-05-19: qty 20

## 2024-05-19 MED ORDER — DEXAMETHASONE SODIUM PHOSPHATE 10 MG/ML IJ SOLN
10.0000 mg | Freq: Once | INTRAMUSCULAR | Status: AC
  Administered 2024-05-19: 10 mg

## 2024-05-19 MED ORDER — ROPIVACAINE HCL 2 MG/ML IJ SOLN
1.0000 mL | Freq: Once | INTRAMUSCULAR | Status: AC
  Administered 2024-05-19: 1 mL via EPIDURAL

## 2024-05-19 MED ORDER — DEXAMETHASONE SODIUM PHOSPHATE 10 MG/ML IJ SOLN
INTRAMUSCULAR | Status: AC
  Filled 2024-05-19: qty 1

## 2024-05-19 MED ORDER — MIDAZOLAM HCL 5 MG/5ML IJ SOLN
INTRAMUSCULAR | Status: AC
  Filled 2024-05-19: qty 5

## 2024-05-19 MED ORDER — IOHEXOL 180 MG/ML  SOLN
INTRAMUSCULAR | Status: AC
  Filled 2024-05-19: qty 10

## 2024-05-19 MED ORDER — MIDAZOLAM HCL 5 MG/5ML IJ SOLN
0.5000 mg | Freq: Once | INTRAMUSCULAR | Status: AC
  Administered 2024-05-19: 2 mg via INTRAVENOUS

## 2024-05-19 MED ORDER — PENTAFLUOROPROP-TETRAFLUOROETH EX AERO
INHALATION_SPRAY | Freq: Once | CUTANEOUS | Status: AC
  Administered 2024-05-19: 30 via TOPICAL

## 2024-05-19 MED ORDER — FENTANYL CITRATE (PF) 100 MCG/2ML IJ SOLN
INTRAMUSCULAR | Status: AC
Start: 2024-05-19 — End: 2024-05-19
  Filled 2024-05-19: qty 2

## 2024-05-19 MED ORDER — SODIUM CHLORIDE (PF) 0.9 % IJ SOLN
INTRAMUSCULAR | Status: AC
  Filled 2024-05-19: qty 10

## 2024-05-19 MED ORDER — FENTANYL CITRATE (PF) 100 MCG/2ML IJ SOLN: 25.0000 ug | INTRAMUSCULAR | Status: DC | PRN

## 2024-05-19 MED ORDER — SODIUM CHLORIDE 0.9% FLUSH
1.0000 mL | Freq: Once | INTRAVENOUS | Status: AC
  Administered 2024-05-19: 1 mL

## 2024-05-19 MED ORDER — ROPIVACAINE HCL 2 MG/ML IJ SOLN
INTRAMUSCULAR | Status: AC
  Filled 2024-05-19: qty 20

## 2024-05-19 MED ORDER — IOHEXOL 180 MG/ML  SOLN
10.0000 mL | Freq: Once | INTRAMUSCULAR | Status: AC
  Administered 2024-05-19: 10 mL via EPIDURAL

## 2024-05-19 NOTE — Progress Notes (Signed)
 PROVIDER NOTE: Interpretation of information contained herein should be left to medically-trained personnel. Specific patient instructions are provided elsewhere under Patient Instructions section of medical record. This document was created in part using STT-dictation technology, any transcriptional errors that may result from this process are unintentional.  Patient: Heather Curry Type: Established DOB: 1966/01/07 MRN: [id number] PCP: Emilio Kelly DASEN, FNP (Inactive)  Service: Procedure DOS: 05/19/2024 Setting: Ambulatory Location: Ambulatory outpatient facility Delivery: Face-to-face Provider: Eric DELENA Como, MD Specialty: Interventional Pain Management Specialty designation: 09 Location: Outpatient facility Ref. Prov.: No ref. provider found       Interventional Therapy   Type: Cervical Epidural Steroid injection (CESI) (Interlaminar) #1  Laterality: Right (-RT)  Level: C7-T1 DOS: 05/19/2024  Provider: Eric DELENA Como, MD Imaging: Fluoroscopy-guided Spinal ([id number]) Anesthesia: Local anesthesia (1-2% Lidocaine ) Anxiolysis: IV Versed  2.0 mg Sedation: Minimal Sedation None required. No Fentanyl  administered.          Medical Necessity Purpose: Diagnostic/Therapeutic Rationale (medical necessity): procedure needed and proper for the diagnosis and/or treatment of Ms. Deluna's medical symptoms and needs. Indications: Cervicalgia, cervical radicular pain, degenerative disc disease, severe enough to impact quality of life or function. 1. Cervicalgia   2. Cervical spinal stenosis (C5-6)   3. Cervical radiculopathy at C8 (Right)   4. Cervical radiculopathy at C7 (Right)   5. Cervical radiculopathy (Right)   6. 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 Cervical facet joint pain   8. Cervical facet hypertrophy (Multilevel) (Bilateral)   9. Cervical facet arthropathy   10. Cervical disc disorder with radiculopathy, cervicothoracic region   11. Chronic  shoulder pain (2ry area of Pain) (Bilateral)   12. Chronic upper extremity pain (3ry area of Pain) (Right)   13. DDD (degenerative disc disease), cervical   14. Abnormal MRI, cervical spine (09/12/2023)   15. Neck pain of over 3 months duration   16. Neck pain on right side    NAS-11 Pain score:   Pre-procedure: 7 /10   Post-procedure: 0-No pain/10     Position  Prep  Materials:  Location setting: Procedure suite Position: Prone, on modified reverse trendelenburg to facilitate breathing, with head in head-cradle. Pillows positioned under chest (below chin-level) with cervical spine flexed. Safety Precautions: Patient was assessed for positional comfort and pressure points before starting the procedure. Prepping solution: DuraPrep (Iodine Povacrylex [0.7% available iodine] and Isopropyl Alcohol, 74% w/w) Prep Area: Entire  cervicothoracic region Approach: percutaneous, paramedial Intended target: Posterior cervical epidural space Materials Procedure:  Tray: Epidural Needle(s): Epidural (Tuohy) Qty: 1 Length: (90mm) 3.5-inch Gauge: 17G  H&P (Pre-op Assessment):  Ms. Maturin is a 58 y.o. (year old), female patient, seen today for interventional treatment. She  has a past surgical history that includes Breast surgery (Left); Appendectomy; Cervix removal; Colonoscopy with propofol  (N/A, 04/18/2018); Colonoscopy with propofol  (N/A, 04/17/2019); Colonoscopy with propofol  (N/A, 06/01/2020); Breast biopsy (Right, 2007); Breast biopsy (Right, 09/27/2020); and Colonoscopy with propofol  (N/A, 06/01/2022). Ms. Pagliarulo has a current medication list which includes the following prescription(s): aspirin ec, budesonide-formoterol, bupropion, diclofenac sodium, escitalopram, gabapentin, hydrochlorothiazide , levothyroxine , meloxicam , montelukast , rosuvastatin, and tizanidine , and the following Facility-Administered Medications: fentanyl . Her primarily concern today is the Pain (Between shoulder  blades)  Initial Vital Signs:  Pulse/HCG Rate: (!) 59ECG Heart Rate: 64 Temp: 97.6 F (36.4 C) Resp: 16 BP: 135/82 SpO2: 98 %  BMI: Estimated body mass index is 30.55 kg/m as calculated from the following:   Height as of this encounter: 5' 4 (1.626 m).  Weight as of this encounter: 178 lb (80.7 kg).  Risk Assessment: Allergies: Reviewed. She is allergic to bee venom and penicillins.  Allergy Precautions: None required Coagulopathies: Reviewed. None identified.  Blood-thinner therapy: None at this time Active Infection(s): Reviewed. None identified. Ms. Renda is afebrile  Site Confirmation: Ms. Dupuis was asked to confirm the procedure and laterality before marking the site Procedure checklist: Completed Consent: Before the procedure and under the influence of no sedative(s), amnesic(s), or anxiolytics, the patient was informed of the treatment options, risks and possible complications. To fulfill our ethical and legal obligations, as recommended by the American Medical Association's Code of Ethics, I have informed the patient of my clinical impression; the nature and purpose of the treatment or procedure; the risks, benefits, and possible complications of the intervention; the alternatives, including doing nothing; the risk(s) and benefit(s) of the alternative treatment(s) or procedure(s); and the risk(s) and benefit(s) of doing nothing. The patient was provided information about the general risks and possible complications associated with the procedure. These may include, but are not limited to: failure to achieve desired goals, infection, bleeding, organ or nerve damage, allergic reactions, paralysis, and death. In addition, the patient was informed of those risks and complications associated to Spine-related procedures, such as failure to decrease pain; infection (i.e.: Meningitis, epidural or intraspinal abscess); bleeding (i.e.: epidural hematoma, subarachnoid hemorrhage, or any  other type of intraspinal or peri-dural bleeding); organ or nerve damage (i.e.: Any type of peripheral nerve, nerve root, or spinal cord injury) with subsequent damage to sensory, motor, and/or autonomic systems, resulting in permanent pain, numbness, and/or weakness of one or several areas of the body; allergic reactions; (i.e.: anaphylactic reaction); and/or death. Furthermore, the patient was informed of those risks and complications associated with the medications. These include, but are not limited to: allergic reactions (i.e.: anaphylactic or anaphylactoid reaction(s)); adrenal axis suppression; blood sugar elevation that in diabetics may result in ketoacidosis or comma; water retention that in patients with history of congestive heart failure may result in shortness of breath, pulmonary edema, and decompensation with resultant heart failure; weight gain; swelling or edema; medication-induced neural toxicity; particulate matter embolism and blood vessel occlusion with resultant organ, and/or nervous system infarction; and/or aseptic necrosis of one or more joints. Finally, the patient was informed that Medicine is not an exact science; therefore, there is also the possibility of unforeseen or unpredictable risks and/or possible complications that may result in a catastrophic outcome. The patient indicated having understood very clearly. We have given the patient no guarantees and we have made no promises. Enough time was given to the patient to ask questions, all of which were answered to the patient's satisfaction. Ms. Sabas has indicated that she wanted to continue with the procedure. Attestation: I, the ordering provider, attest that I have discussed with the patient the benefits, risks, side-effects, alternatives, likelihood of achieving goals, and potential problems during recovery for the procedure that I have provided informed consent. Date  Time: 05/19/2024 11:09 AM  Pre-Procedure Preparation:   Monitoring: As per clinic protocol. Respiration, ETCO2, SpO2, BP, heart rate and rhythm monitor placed and checked for adequate function Safety Precautions: Patient was assessed for positional comfort and pressure points before starting the procedure. Time-out: I initiated and conducted the Time-out before starting the procedure, as per protocol. The patient was asked to participate by confirming the accuracy of the Time Out information. Verification of the correct person, site, and procedure were performed and confirmed by me, the nursing  staff, and the patient. Time-out conducted as per Joint Commission's Universal Protocol (UP.01.01.01). Time: 1152 Start Time: 1152 hrs.  Description  Narrative of Procedure:          Rationale (medical necessity): procedure needed and proper for the diagnosis and/or treatment of the patient's medical symptoms and needs. Start Time: 1152 hrs. Safety Precautions: Aspiration looking for blood return was conducted prior to all injections. At no point did we inject any substances, as a needle was being advanced. No attempts were made at seeking any paresthesias. Safe injection practices and needle disposal techniques used. Medications properly checked for expiration dates. SDV (single dose vial) medications used. Description of procedure: Protocol guidelines were followed. The patient was assisted into a comfortable position. The target area was identified and the area prepped in the usual manner. Skin & deeper tissues infiltrated with local anesthetic. Appropriate amount of time allowed to pass for local anesthetics to take effect. Using fluoroscopic guidance, the epidural needle was introduced through the skin, ipsilateral to the reported pain, and advanced to the target area. Posterior laminar os was contacted and the needle walked caudad, until the lamina was cleared. The ligamentum flavum was engaged and the epidural space identified using "loss-of-resistance  technique" with 2-3 ml of PF-NaCl (0.9% NSS), in a 5cc dedicated LOR syringe. (See Imaging guidance below for use of contrast details.) Once proper needle placement was secured, and negative aspiration confirmed, the solution was injected in intermittent fashion, asking for systemic symptoms every 0.5cc. The needles were then removed and the area cleansed, making sure to leave some of the prepping solution back to take advantage of its long term bactericidal properties.  Vitals:   05/19/24 1150 05/19/24 1155 05/19/24 1200 05/19/24 1208  BP: 130/81 (!) 126/90 131/89 112/79  Pulse:      Resp: 15 16 16 16   Temp:      TempSrc:      SpO2: 99% 100% 100% 97%  Weight:      Height:         End Time: 1159 hrs.  Imaging Guidance (Spinal):          Type of Imaging Technique: Fluoroscopy Guidance (Spinal) Indication(s): Fluoroscopy guidance for needle placement to enhance accuracy in procedures requiring precise needle localization for targeted delivery of medication in or near specific anatomical locations not easily accessible without such real-time imaging assistance. Exposure Time: Please see nurses notes. Contrast: Before injecting any contrast, we confirmed that the patient did not have an allergy to iodine, shellfish, or radiological contrast. Once satisfactory needle placement was completed at the desired level, radiological contrast was injected. Contrast injected under live fluoroscopy. No contrast complications. See chart for type and volume of contrast used. Fluoroscopic Guidance: I was personally present during the use of fluoroscopy. Tunnel Vision Technique used to obtain the best possible view of the target area. Parallax error corrected before commencing the procedure. Direction-depth-direction technique used to introduce the needle under continuous pulsed fluoroscopy. Once target was reached, antero-posterior, oblique, and lateral fluoroscopic projection used confirm needle placement  in all planes. Images permanently stored in EMR. Interpretation: I personally interpreted the imaging intraoperatively. Adequate needle placement confirmed in multiple planes. Appropriate spread of contrast into desired area was observed. No evidence of afferent or efferent intravascular uptake. No intrathecal or subarachnoid spread observed. Permanent images saved into the patient's record.  Post-operative Assessment:  Post-procedure Vital Signs:  Pulse/HCG Rate: (!) 5962 Temp: 97.6 F (36.4 C) Resp: 16 BP: 112/79 SpO2: 97 %  EBL: None  Complications: No immediate post-treatment complications observed by team, or reported by patient.  Note: The patient tolerated the entire procedure well. A repeat set of vitals were taken after the procedure and the patient was kept under observation following institutional policy, for this type of procedure. Post-procedural neurological assessment was performed, showing return to baseline, prior to discharge. The patient was provided with post-procedure discharge instructions, including a section on how to identify potential problems. Should any problems arise concerning this procedure, the patient was given instructions to immediately contact us , at any time, without hesitation. In any case, we plan to contact the patient by telephone for a follow-up status report regarding this interventional procedure.  Comments:  No additional relevant information.  Plan of Care (POC)  Orders:  Orders Placed This Encounter  Procedures   Cervical Epidural Injection    Indication(s): Radiculitis and cervicalgia associated with cervical degenerative disc disease. Position: Prone Imaging guidance: Fluoroscopy required. Contrast required unless contraindicated by allergy or severe CKD. Equipment & Materials: Epidural tray & needle.    Scheduling Instructions:     Procedure: Cervical Epidural Steroid Injection/Block     Planned Level(s): C7-T1     Laterality:  Right-sided     Anxiolysis: Patient's choice.     Date: 05/19/2024    Where will this procedure be performed?:   [facility]             by Dr. T[facility] 1-60 MIN NO REPORT    Intraoperative interpretation by procedural physician at [facility].    Standing Status:   Standing    Number of Occurrences:   1    Reason for exam::   Assistance in needle guidance and placement for procedures requiring needle placement in or near specific anatomical locations not easily accessible without such assistance.   Informed Consent Details: Physician/Practitioner Attestation; Transcribe to consent form and obtain patient signature    Nursing instructions: Transcribe to consent form and obtain patient signature. Always confirm laterality of pain with Ms. Newingham, before procedure.    Physician/Practitioner attestation of informed consent for procedure/surgical case:   I, the physician/practitioner, attest that I have discussed with the patient the benefits, risks, side effects, alternatives, likelihood of achieving goals and potential problems during recovery for the procedure that I have provided informed consent.    Procedure:   Cervical Epidural Steroid Injection (CESI) under fluoroscopic guidance    Physician/Practitioner performing the procedure:   Dragon Thrush A. Tanya MD    Indication/Reason:   Indications: Cervicalgia (neck pain), cervical radicular pain, radiculitis (arm/shoulder pain, numbness, and/or weakness), degenerative disc disease, severe enough to greatly impact quality of life or function.   Provide equipment / supplies at bedside    Procedural tray: Epidural Tray (Disposable  single use) Skin infiltration needle: Regular 1.5-in, 25-G, (x1) Block needle size: Regular standard Catheter: No catheter required    Standing Status:   Standing    Number of Occurrences:   1    Specify:   Epidural Tray   Saline lock IV    Have LR [phone] mL available and  administer at 125 mL/hr if patient becomes hypotensive.    Standing Status:   Standing    Number of Occurrences:   1     Opioid Analgesic: None MME/day: 0 mg/day    Medications ordered for procedure: Meds ordered this encounter  Medications   iohexol  (OMNIPAQUE ) 180 MG/ML injection 10 mL  Must be Myelogram-compatible. If not available, you may substitute with a water-soluble, non-ionic, hypoallergenic, myelogram-compatible radiological contrast medium.   lidocaine  (XYLOCAINE ) 2 % (with pres) injection 400 mg   pentafluoroprop-tetrafluoroeth (GEBAUERS) aerosol   midazolam  (VERSED ) 5 MG/5ML injection 0.5-2 mg    Make sure Flumazenil is available in the pyxis when using this medication. If oversedation occurs, administer 0.2 mg IV over 15 sec. If after 45 sec no response, administer 0.2 mg again over 1 min; may repeat at 1 min intervals; not to exceed 4 doses (1 mg)   fentaNYL  (SUBLIMAZE ) injection 25-50 mcg    Make sure Narcan is available in the pyxis when using this medication. In the event of respiratory depression (RR< 8/min): Titrate NARCAN (naloxone) in increments of 0.1 to 0.2 mg IV at 2-3 minute intervals, until desired degree of reversal.   sodium chloride  flush (NS) 0.9 % injection 1 mL   ropivacaine  (PF) 2 mg/mL (0.2%) (NAROPIN ) injection 1 mL   dexamethasone  (DECADRON ) injection 10 mg   Medications administered: We administered iohexol , lidocaine , pentafluoroprop-tetrafluoroeth, midazolam , sodium chloride  flush, ropivacaine  (PF) 2 mg/mL (0.2%), and dexamethasone .  See the medical record for exact dosing, route, and time of administration.    Interventional Therapies  Risk Factors  Considerations  Medical Comorbidities:  ALLERGY: PCN  (04/14/2024) UDS (+) THC (Marijuana use)  BA  Bradycardia  HTN  PTSD  Depression     Planned  Pending:   Diagnostic/therapeutic right interlaminar cervical ESI #1    Under consideration:   Diagnostic/therapeutic right  interlaminar cervical ESI #1  Diagnostic bilateral cervical facet MBB #1 w/ possible RFA follow-up Diagnostic bilateral IA shoulder joint inj. #1  Diagnostic/therapeutic right T12-L1 LESI #1  Diagnostic/therapeutic left L2-3 LESI #1  Diagnostic/therapeutic midline L2-3 LESI #1  Diagnostic/therapeutic midline L3-4 LESI #1  Diagnostic/therapeutic bilateral L2 TFESI #1  Diagnostic/therapeutic bilateral L3 TFESI #1  Diagnostic bilateral lumbar facet MBB #1 w/ possible RFA follow-up   Completed: (Analgesic benefit)1  None at this time   Therapeutic  Palliative (PRN) options:   None established   Completed by other providers:   Diagnostic/therapeutic right C5-6 TFESI x1 (10/11/2023) by Morene Falcon, DO [facility] PMR)  Diagnostic/therapeutic bilateral L3-4 TFESI x1 (07/12/2023) by Morene Falcon, DO ([facility] PMR)   1(Analgesic benefit): Expressed in percentage (%). (Local anesthetic[LA] +/- sedation  L.A.Local Anesthetic  Steroid benefit  Ongoing benefit)      Follow-up plan:   Return in about 2 weeks (around 06/02/2024) for Eval-day (M,W), (Face2F), (PPE).     Recent Visits Date Type Provider Dept  05/11/24 Office Visit Tanya Glisson, MD [facility]  04/14/24 Office Visit Tanya Glisson, MD [facility]  Showing recent visits within past 90 days and meeting all other requirements Today's Visits Date Type Provider Dept  05/19/24 Procedure visit Tanya Glisson, MD [facility]  Showing today's visits and meeting all other requirements Future Appointments Date Type Provider Dept  06/08/24 Appointment Tanya Glisson, MD [facility]  Showing future appointments within next 90 days and meeting all other requirements   Disposition: Discharge home  Discharge (Date  Time): 05/19/2024;   hrs.   Primary Care Physician: Emilio Kelly DASEN, FNP (Inactive) Location: [facility] Note by: Glisson DELENA Tanya, MD  (TTS technology used. I apologize for any typographical errors that were not detected and corrected.) Date: 05/19/2024; Time: 12:16 PM  Disclaimer:  Medicine is not an Visual merchandiser. The only guarantee in medicine is that nothing is  guaranteed. It is important to note that the decision to proceed with this intervention was based on the information collected from the patient. The Data and conclusions were drawn from the patient's questionnaire, the interview, and the physical examination. Because the information was provided in large part by the patient, it cannot be guaranteed that it has not been purposely or unconsciously manipulated. Every effort has been made to obtain as much relevant data as possible for this evaluation. It is important to note that the conclusions that lead to this procedure are derived in large part from the available data. Always take into account that the treatment will also be dependent on availability of resources and existing treatment guidelines, considered by other Pain Management Practitioners as being common knowledge and practice, at the time of the intervention. For Medico-Legal purposes, it is also important to point out that variation in procedural techniques and pharmacological choices are the acceptable norm. The indications, contraindications, technique, and results of the above procedure should only be interpreted and judged by a Board-Certified Interventional Pain Specialist with extensive familiarity and expertise in the same exact procedure and technique.

## 2024-05-19 NOTE — Progress Notes (Signed)
 Safety precautions to be maintained throughout the outpatient stay will include: orient to surroundings, keep bed in low position, maintain call bell within reach at all times, provide assistance with transfer out of bed and ambulation.

## 2024-05-19 NOTE — Patient Instructions (Signed)

## 2024-05-20 ENCOUNTER — Telehealth: Payer: Self-pay | Admitting: *Deleted

## 2024-05-20 NOTE — Telephone Encounter (Signed)
 Post procedure call; no questions or concerns.  Is using heat today and moving around.  Is feeling improvement and no new pain. Long conversation had with patient and description of pain and activities, obligations of her everyday etc discussed.  She is hopeful that this will improve her pain as she works full-time and cares for her elderly mother and aunt.  Patient has very good disposition and is encouraged so far by our plan of care.

## 2024-06-07 NOTE — Progress Notes (Unsigned)
 PROVIDER NOTE: Interpretation of information contained herein should be left to medically-trained personnel. Specific patient instructions are provided elsewhere under Patient Instructions section of medical record. This document was created in part using AI and STT-dictation technology, any transcriptional errors that may result from this process are unintentional.  Patient: Heather Curry  Service: E/M   PCP: Emilio Kelly DASEN, FNP (Inactive)  DOB: 1966-06-06  DOS: 06/08/2024  Provider: Eric DELENA Como, MD  MRN: [id number]  Delivery: Face-to-face  Specialty: Interventional Pain Management  Type: Established Patient  Setting: Ambulatory outpatient facility  Specialty designation: 09  Referring Prov.: No ref. provider found  Location: Outpatient office facility       History of present illness (HPI) [name], a 58 y.o. year old female, is here today because of her Cervicalgia [M54.2]. [name] primary complain today is No chief complaint on file.  Pertinent problems: 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, L3-4); Chronic pain syndrome; Abnormal MRI, lumbar spine (05/04/2023); Abnormal MRI, cervical spine (09/12/2023); Cervicalgia; Chronic neck and back pain (1ry area of Pain); Chronic shoulder pain (2ry area of Pain) (Bilateral); Chronic upper extremity pain (3ry area of Pain) (Right); Cervical radiculopathy at C7 (Right); Cervical radiculopathy at C8 (Right); Cervical radiculopathy (Right); Cervical disc disorder with radiculopathy, cervicothoracic region; Radicular pain of shoulder (Bilateral); Chronic low back pain (4th area of Pain) (Bilateral) w/ sciatica (Bilateral); Chronic low back pain (Bilateral) w/o sciatica; Chronic hip pain (Bilateral); Chronic lower extremity pain (5th area of Pain) (Bilateral); Chronic lower extremity radicular pain (Bilateral); Lumbar facet joint pain; Cervical facet joint  pain; Lumbar radiculitis (Bilateral); Lumbosacral radiculopathy at L5 (Bilateral); Lumbosacral radiculopathy at S1 (Bilateral); Chronic upper back pain; Chronic thoracic back pain (Bilateral); Cervical facet arthropathy; Cervical facet joint syndrome; DDD (degenerative disc disease), cervical; 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, 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); Cervical spinal stenosis (C5-6); Chronic sacroiliac joint pain (Right); Thoracolumbar spine levoscoliosis; Lumbar facet arthropathy (Multilevel) (Bilateral); DDD (degenerative disc disease), lumbar w/ LEP; Lumbar intervertebral disc protrusion (Right: T12-L1) (Left: L2-3); 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, L3-4); Lumbar spondylosis; Low back pain of over 3 months duration; Low back pain potentially associated with radiculopathy; Low back pain radiating to legs (Bilateral); Multifactorial low back pain; Osteoarthritis of hips (Bilateral); Osteoarthritis of acromioclavicular joints (Bilateral); Osteoarthritis of glenohumeral joint (Right); Osteoarthritis of shoulders (Bilateral); DDD (degenerative disc disease), thoracic; DDD (degenerative disc disease), lumbar; Cervical facet hypertrophy (Multilevel) (Bilateral); Lumbar facet hypertrophy (Multilevel) (Bilateral); Neck pain of over 3 months duration; and Neck pain on right side on their pertinent problem list.  Pain Assessment: Severity of   is reported as a  /10. Location:    / . Onset:  . Quality:  . Timing:  . Modifying factor(s):  SABRA Vitals:  vitals were not taken for this visit.  BMI: Estimated body mass index is 30.55 kg/m as calculated from the following:   Height as of 05/19/24: 5' 4 (1.626 m).   Weight as of 05/19/24: 178 lb (80.7 kg).  Last encounter: 05/11/2024. Last procedure: 05/19/2024.  Reason for encounter: post-procedure evaluation and assessment.   Discussed the use of AI scribe software for clinical note transcription with the patient, who gave verbal consent to  proceed.  History of Present Illness          Post-Procedure Evaluation   Type: Cervical Epidural Steroid injection (CESI) (Interlaminar) #1  Laterality: Right (-RT)  Level: C7-T1 DOS: 05/19/2024  Provider: Eric  DELENA Como, MD Imaging: Fluoroscopy-guided Spinal ([id number]) Anesthesia: Local anesthesia (1-2% Lidocaine ) Anxiolysis: IV Versed  2.0 mg Sedation: Minimal Sedation None required. No Fentanyl  administered.          Medical Necessity Purpose: Diagnostic/Therapeutic Rationale (medical necessity): procedure needed and proper for the diagnosis and/or treatment of [name]'s medical symptoms and needs. Indications: Cervicalgia, cervical radicular pain, degenerative disc disease, severe enough to impact quality of life or function. 1. Cervicalgia   2. Cervical spinal stenosis (C5-6)   3. Cervical radiculopathy at C8 (Right)   4. Cervical radiculopathy at C7 (Right)   5. Cervical radiculopathy (Right)   6. 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 Cervical facet joint pain   8. Cervical facet hypertrophy (Multilevel) (Bilateral)   9. Cervical facet arthropathy   10. Cervical disc disorder with radiculopathy, cervicothoracic region   11. Chronic shoulder pain (2ry area of Pain) (Bilateral)   12. Chronic upper extremity pain (3ry area of Pain) (Right)   13. DDD (degenerative disc disease), cervical   14. Abnormal MRI, cervical spine (09/12/2023)   15. Neck pain of over 3 months duration   16. Neck pain on right side    NAS-11 Pain score:   Pre-procedure: 7 /10   Post-procedure: 0-No pain/10     Effectiveness:  Initial hour after procedure:   ***. Subsequent 4-6 hours post-procedure:   ***. Analgesia past initial 6 hours:   ***. Ongoing improvement:  Analgesic:  *** Function:    ***    ROM:    ***     Pharmacotherapy Assessment   Opioid Analgesic: None MME/day: 0 mg/day   Monitoring: [place] PMP: PDMP reviewed during this encounter.        Pharmacotherapy: No side-effects or adverse reactions reported. Compliance: No problems identified. Effectiveness: Clinically acceptable.  No notes on file  UDS:  Summary  Date Value Ref Range Status  04/14/2024 FINAL  Final    Comment:    ==================================================================== Compliance Drug Analysis, Ur ==================================================================== Test                             Result       Flag       Units  Drug Present and Declared for Prescription Verification   Gabapentin                     PRESENT      EXPECTED   Bupropion                      PRESENT      EXPECTED   Hydroxybupropion               PRESENT      EXPECTED    Hydroxybupropion is an expected metabolite of bupropion.  Drug Present not Declared for Prescription Verification   Carboxy-THC                    155          UNEXPECTED ng/mg creat    Carboxy-THC is a metabolite of tetrahydrocannabinol (THC). Source of    THC is most commonly herbal marijuana or marijuana-based products,    but THC is also present in a scheduled prescription medication.    Trace amounts of THC can be present in hemp and cannabidiol (CBD)    products. This test is not intended to distinguish between delta-9-  tetrahydrocannabinol, the predominant form of THC in most herbal or    marijuana-based products, and delta-8-tetrahydrocannabinol.  Drug Absent but Declared for Prescription Verification   Tizanidine                      Not Detected UNEXPECTED    Tizanidine , as indicated in the declared medication list, is not    always detected even when used as directed.    Diclofenac                     Not Detected UNEXPECTED    Diclofenac, as indicated in the declared medication list, is not    always detected even when used as directed.    Salicylate                     Not Detected UNEXPECTED    Aspirin, as indicated in the declared medication list, is not always    detected  even when used as directed.  ==================================================================== Test                      Result    Flag   Units      Ref Range   Creatinine              120              mg/dL      >=79 ==================================================================== Declared Medications:  The flagging and interpretation on this report are based on the  following declared medications.  Unexpected results may arise from  inaccuracies in the declared medications.   **Note: The testing scope of this panel includes these medications:   Bupropion (Wellbutrin XL)  Gabapentin (Neurontin)   **Note: The testing scope of this panel does not include small to  moderate amounts of these reported medications:   Aspirin  Diclofenac (Voltaren)  Tizanidine  (Zanaflex )   **Note: The testing scope of this panel does not include the  following reported medications:   Budesonide (Symbicort)  Formoterol (Symbicort)  Hydrochlorothiazide  (Hydrodiuril )  Levothyroxine  (Synthroid )  Meloxicam  (Mobic )  Montelukast  (Singulair )  Rosuvastatin (Crestor) ==================================================================== For clinical consultation, please call [phone]. ====================================================================     No results found for: CBDTHCR No results found for: D8THCCBX No results found for: D9THCCBX  ROS  Constitutional: Denies any fever or chills Gastrointestinal: No reported hemesis, hematochezia, vomiting, or acute GI distress Musculoskeletal: Denies any acute onset joint swelling, redness, loss of ROM, or weakness Neurological: No reported episodes of acute onset apraxia, aphasia, dysarthria, agnosia, amnesia, paralysis, loss of coordination, or loss of consciousness  Medication Review  aspirin EC, buPROPion, budesonide-formoterol, diclofenac Sodium, escitalopram, gabapentin, hydrochlorothiazide , levothyroxine , meloxicam ,  montelukast , rosuvastatin, and tiZANidine   History Review  Allergy: Ms. Birt is allergic to bee venom and penicillins. Drug: Ms. Kapler  reports no history of drug use. Alcohol:  reports current alcohol use. Tobacco:  reports that she quit smoking about 16 years ago. Her smoking use included cigarettes. She has never used smokeless tobacco. Social: Ms. Cando  reports that she quit smoking about 16 years ago. Her smoking use included cigarettes. She has never used smokeless tobacco. She reports current alcohol use. She reports that she does not use drugs. Medical:  has a past medical history of Colon polyps and Hypertension. Surgical: Ms. Zaborowski  has a past surgical history that includes Breast surgery (Left); Appendectomy; Cervix removal; Colonoscopy with propofol  (N/A, 04/18/2018); Colonoscopy with propofol  (N/A, 04/17/2019); Colonoscopy  with propofol  (N/A, 06/01/2020); Breast biopsy (Right, 2007); Breast biopsy (Right, 09/27/2020); and Colonoscopy with propofol  (N/A, 06/01/2022). Family: family history includes Alcohol abuse in her father; Arthritis in her maternal grandmother and mother; Breast cancer (age of onset: 53) in her maternal aunt; COPD in her father; Cancer in her maternal grandmother; Congestive Heart Failure in her mother; Hypertension in her father, mother, and sister.  Laboratory Chemistry Profile   Renal Lab Results  Component Value Date   BUN 13 04/14/2024   CREATININE 0.95 04/14/2024   BCR 14 04/14/2024   GFRAA 100 05/24/2020   GFRNONAA 87 05/24/2020    Hepatic Lab Results  Component Value Date   AST 22 04/14/2024   ALT 45 (H) 09/20/2023   ALBUMIN 4.6 04/14/2024   ALKPHOS 175 (H) 04/14/2024    Electrolytes Lab Results  Component Value Date   NA 139 04/14/2024   K 4.2 04/14/2024   CL 100 04/14/2024   CALCIUM 10.6 (H) 04/14/2024   MG 2.2 04/14/2024    Bone Lab Results  Component Value Date   VD25OH 20.5 (L) 07/01/2023   25OHVITD1 32 04/14/2024    25OHVITD2 <1.0 04/14/2024   25OHVITD3 32 04/14/2024    Inflammation (CRP: Acute Phase) (ESR: Chronic Phase) Lab Results  Component Value Date   CRP <1 04/14/2024   ESRSEDRATE 13 04/14/2024         Note: Above Lab results reviewed.  Recent Imaging Review  DG PAIN CLINIC C-ARM 1-60 MIN NO REPORT Fluoro was used, but no Radiologist interpretation will be provided.  Please refer to NOTES tab for provider progress note. Note: Reviewed        Physical Exam  Vitals: There were no vitals taken for this visit. BMI: Estimated body mass index is 30.55 kg/m as calculated from the following:   Height as of 05/19/24: 5' 4 (1.626 m).   Weight as of 05/19/24: 178 lb (80.7 kg). Ideal: Patient weight not recorded General appearance: Well nourished, well developed, and well hydrated. In no apparent acute distress Mental status: Alert, oriented x 3 (person, place, & time)       Respiratory: No evidence of acute respiratory distress Eyes: PERLA   Assessment   Diagnosis Status  1. Cervicalgia   2. Cervical radiculopathy at C8 (Right)   3. Cervical radiculopathy at C7 (Right)   4. Cervical radiculopathy (Right)   5. Postop check    Controlled Controlled Controlled   Updated Problems: No problems updated.  Plan of Care  Problem-specific:  Assessment and Plan            Ms. BRYCELYN GAMBINO has a current medication list which includes the following long-term medication(s): budesonide-formoterol, bupropion, escitalopram, gabapentin, hydrochlorothiazide , levothyroxine , montelukast , and rosuvastatin.  Pharmacotherapy (Medications Ordered): No orders of the defined types were placed in this encounter.  Orders:  No orders of the defined types were placed in this encounter.    Interventional Therapies  Risk Factors  Considerations  Medical Comorbidities:  ALLERGY: PCN  (04/14/2024) UDS (+) THC (Marijuana use)  BA  Bradycardia  HTN  PTSD  Depression     Planned  Pending:    Diagnostic/therapeutic right interlaminar cervical ESI #1    Under consideration:   Diagnostic/therapeutic right interlaminar cervical ESI #1  Diagnostic bilateral cervical facet MBB #1 w/ possible RFA follow-up Diagnostic bilateral IA shoulder joint inj. #1  Diagnostic/therapeutic right T12-L1 LESI #1  Diagnostic/therapeutic left L2-3 LESI #1  Diagnostic/therapeutic midline L2-3 LESI #1  Diagnostic/therapeutic midline L3-4  LESI #1  Diagnostic/therapeutic bilateral L2 TFESI #1  Diagnostic/therapeutic bilateral L3 TFESI #1  Diagnostic bilateral lumbar facet MBB #1 w/ possible RFA follow-up   Completed: (Analgesic benefit)1  None at this time   Therapeutic  Palliative (PRN) options:   None established   Completed by other providers:   Diagnostic/therapeutic right C5-6 TFESI x1 (10/11/2023) by Morene Falcon, DO [facility])  Diagnostic/therapeutic bilateral L3-4 TFESI x1 (07/12/2023) by Morene Falcon, DO ([facility])   1(Analgesic benefit): Expressed in percentage (%). (Local anesthetic[LA] +/- sedation  L.A.Local Anesthetic  Steroid benefit  Ongoing benefit)     No follow-ups on file.    Recent Visits Date Type Provider Dept  05/19/24 Procedure visit Tanya Glisson, MD [facility]  05/11/24 Office Visit Tanya Glisson, MD [facility]  04/14/24 Office Visit Tanya Glisson, MD [facility]  Showing recent visits within past 90 days and meeting all other requirements Future Appointments Date Type Provider Dept  06/08/24 Appointment Tanya Glisson, MD [facility]  Showing future appointments within next 90 days and meeting all other requirements  I discussed the assessment and treatment plan with the patient. The patient was provided an opportunity to ask questions and all were answered. The patient agreed with the plan and demonstrated an understanding of the instructions.  Patient advised to call back or seek an in-person  evaluation if the symptoms or condition worsens.  Duration of encounter: *** minutes.  Total time on encounter, as per AMA guidelines included both the face-to-face and non-face-to-face time personally spent by the physician and/or other qualified health care professional(s) on the day of the encounter (includes time in activities that require the physician or other qualified health care professional and does not include time in activities normally performed by clinical staff). Physician's time may include the following activities when performed: Preparing to see the patient (e.g., pre-charting review of records, searching for previously ordered imaging, lab work, and nerve conduction tests) Review of prior analgesic pharmacotherapies. Reviewing PMP Interpreting ordered tests (e.g., lab work, imaging, nerve conduction tests) Performing post-procedure evaluations, including interpretation of diagnostic procedures Obtaining and/or reviewing separately obtained history Performing a medically appropriate examination and/or evaluation Counseling and educating the patient/family/caregiver Ordering medications, tests, or procedures Referring and communicating with other health care professionals (when not separately reported) Documenting clinical information in the electronic or other health record Independently interpreting results (not separately reported) and communicating results to the patient/ family/caregiver Care coordination (not separately reported)  Note by: Glisson DELENA Tanya, MD (TTS and AI technology used. I apologize for any typographical errors that were not detected and corrected.) Date: 06/08/2024; Time: 4:12 PM

## 2024-06-08 ENCOUNTER — Encounter: Payer: Self-pay | Admitting: Pain Medicine

## 2024-06-08 ENCOUNTER — Ambulatory Visit: Attending: Pain Medicine | Admitting: Pain Medicine

## 2024-06-08 VITALS — BP 155/82 | HR 55 | Temp 97.6°F | Ht 64.0 in | Wt 178.0 lb

## 2024-06-08 DIAGNOSIS — M25511 Pain in right shoulder: Secondary | ICD-10-CM | POA: Diagnosis present

## 2024-06-08 DIAGNOSIS — M79601 Pain in right arm: Secondary | ICD-10-CM | POA: Diagnosis not present

## 2024-06-08 DIAGNOSIS — M5412 Radiculopathy, cervical region: Secondary | ICD-10-CM | POA: Diagnosis present

## 2024-06-08 DIAGNOSIS — M5013 Cervical disc disorder with radiculopathy, cervicothoracic region: Secondary | ICD-10-CM | POA: Diagnosis present

## 2024-06-08 DIAGNOSIS — M4722 Other spondylosis with radiculopathy, cervical region: Secondary | ICD-10-CM

## 2024-06-08 DIAGNOSIS — Z09 Encounter for follow-up examination after completed treatment for conditions other than malignant neoplasm: Secondary | ICD-10-CM | POA: Diagnosis present

## 2024-06-08 DIAGNOSIS — M549 Dorsalgia, unspecified: Secondary | ICD-10-CM | POA: Insufficient documentation

## 2024-06-08 DIAGNOSIS — R937 Abnormal findings on diagnostic imaging of other parts of musculoskeletal system: Secondary | ICD-10-CM | POA: Insufficient documentation

## 2024-06-08 DIAGNOSIS — M25512 Pain in left shoulder: Secondary | ICD-10-CM | POA: Diagnosis present

## 2024-06-08 DIAGNOSIS — G8929 Other chronic pain: Secondary | ICD-10-CM | POA: Diagnosis present

## 2024-06-08 DIAGNOSIS — M542 Cervicalgia: Secondary | ICD-10-CM | POA: Insufficient documentation

## 2024-06-08 DIAGNOSIS — M4802 Spinal stenosis, cervical region: Secondary | ICD-10-CM | POA: Diagnosis not present

## 2024-06-08 DIAGNOSIS — M47812 Spondylosis without myelopathy or radiculopathy, cervical region: Secondary | ICD-10-CM | POA: Diagnosis present

## 2024-06-08 NOTE — Progress Notes (Signed)
 Safety precautions to be maintained throughout the outpatient stay will include: orient to surroundings, keep bed in low position, maintain call bell within reach at all times, provide assistance with transfer out of bed and ambulation.

## 2024-06-08 NOTE — Patient Instructions (Signed)
 ______________________________________________________________________    Procedure instructions  Stop blood-thinners  Do not eat or drink fluids (other than water ) for 6 hours before your procedure  No water  for 2 hours before your procedure  Take your blood pressure medicine with a sip of water   Arrive 30 minutes before your appointment  If sedation is planned, bring suitable driver. [place], [place], & public transportation are NOT APPROVED)  Carefully read the Preparing for your procedure detailed instructions  If you have questions call us  at [phone]  Procedure appointments are for procedures only.   NO medication refills or new problem evaluations will be done on procedure days.   Only the scheduled, pre-approved procedure and side will be done.   ______________________________________________________________________     ______________________________________________________________________    Preparing for your procedure  Appointments: If you think you may not be able to keep your appointment, call 24-48 hours in advance to cancel. We need time to make it available to others.  Procedure visits are for procedures only. During your procedure appointment there will be: NO Prescription Refills*. NO medication changes or discussions*. NO discussion of disability issues*. NO unrelated pain problem evaluations*. NO evaluations to order other pain procedures*. *These will be addressed at a separate and distinct evaluation encounter on the provider's evaluation schedule and not during procedure days.  Instructions: Food intake: Avoid eating anything solid for at least 8 hours prior to your procedure. Clear liquid intake: You may take clear liquids such as water  up to 2 hours prior to your procedure. (No carbonated drinks. No soda.) Transportation: Unless otherwise stated by your physician, bring a driver. (Driver cannot be a Market researcher, Pharmacist, community, or any other form of public  transportation.) Morning Medicines: Except for blood thinners, take all of your other morning medications with a sip of water . Make sure to take your heart and blood pressure medicines. If your blood pressure's lower number is above 100, the case will be rescheduled. Blood thinners: Make sure to stop your blood thinners as instructed.  If you take a blood thinner, but were not instructed to stop it, call our office [phone] and ask to talk to a nurse. Not stopping a blood thinner prior to certain procedures could lead to serious complications. Diabetics on insulin : Notify the staff so that you can be scheduled 1st case in the morning. If your diabetes requires high dose insulin , take only  of your normal insulin  dose the morning of the procedure and notify the staff that you have done so. Preventing infections: Shower with an antibacterial soap the morning of your procedure.  Build-up your immune system: Take 1000 mg of Vitamin C with every meal (3 times a day) the day prior to your procedure. Antibiotics: Inform the nursing staff if you are taking any antibiotics or if you have any conditions that may require antibiotics prior to procedures. (Example: recent joint implants)   Pregnancy: If you are pregnant make sure to notify the nursing staff. Not doing so may result in injury to the fetus, including death.  Sickness: If you have a cold, fever, or any active infections, call and cancel or reschedule your procedure. Receiving steroids while having an infection may result in complications. Arrival: You must be in the facility at least 30 minutes prior to your scheduled procedure. Tardiness: Your scheduled time is also the cutoff time. If you do not arrive at least 15 minutes prior to your procedure, you will be rescheduled.  Children: Do not bring any children with  you. Make arrangements to keep them home. Dress appropriately: There is always a possibility that your clothing may get soiled. Avoid  long dresses. Valuables: Do not bring any jewelry or valuables.  Reasons to call and reschedule or cancel your procedure: (Following these recommendations will minimize the risk of a serious complication.) Surgeries: Avoid having procedures within 2 weeks of any surgery. (Avoid for 2 weeks before or after any surgery). Flu Shots: Avoid having procedures within 2 weeks of a flu shots or . (Avoid for 2 weeks before or after immunizations). Barium: Avoid having a procedure within 7-10 days after having had a radiological study involving the use of radiological contrast. (Myelograms, Barium swallow or enema study). Heart attacks: Avoid any elective procedures or surgeries for the initial 6 months after a Myocardial Infarction (Heart Attack). Blood thinners: It is imperative that you stop these medications before procedures. Let us  know if you if you take any blood thinner.  Infection: Avoid procedures during or within two weeks of an infection (including chest colds or gastrointestinal problems). Symptoms associated with infections include: Localized redness, fever, chills, night sweats or profuse sweating, burning sensation when voiding, cough, congestion, stuffiness, runny nose, sore throat, diarrhea, nausea, vomiting, cold or Flu symptoms, recent or current infections. It is specially important if the infection is over the area that we intend to treat. Heart and lung problems: Symptoms that may suggest an active cardiopulmonary problem include: cough, chest pain, breathing difficulties or shortness of breath, dizziness, ankle swelling, uncontrolled high or unusually low blood pressure, and/or palpitations. If you are experiencing any of these symptoms, cancel your procedure and contact your primary care physician for an evaluation.  Remember:  Regular Business hours are:  Monday to Thursday 8:00 AM to 4:00 PM  Provider's Schedule: Eric Como, MD:  Procedure days: Tuesday and Thursday 7:30  AM to 4:00 PM  Wallie Sherry, MD:  Procedure days: Monday and Wednesday 7:30 AM to 4:00 PM Last  Updated: 08/27/2023 ______________________________________________________________________     ______________________________________________________________________    General Risks and Possible Complications  Patient Responsibilities: It is important that you read this as it is part of your informed consent. It is our duty to inform you of the risks and possible complications associated with treatments offered to you. It is your responsibility as a patient to read this and to ask questions about anything that is not clear or that you believe was not covered in this document.  Patient's Rights: You have the right to refuse treatment. You also have the right to change your mind, even after initially having agreed to have the treatment done. However, under this last option, if you wait until the last second to change your mind, you may be charged for the materials used up to that point.  Introduction: Medicine is not an Visual merchandiser. Everything in Medicine, including the lack of treatment(s), carries the potential for danger, harm, or loss (which is by definition: Risk). In Medicine, a complication is a secondary problem, condition, or disease that can aggravate an already existing one. All treatments carry the risk of possible complications. The fact that a side effects or complications occurs, does not imply that the treatment was conducted incorrectly. It must be clearly understood that these can happen even when everything is done following the highest safety standards.  No treatment: You can choose not to proceed with the proposed treatment alternative. The "PRO(s)" would include: avoiding the risk of complications associated with the therapy. The "CON(s)" would include:  not getting any of the treatment benefits. These benefits fall under one of three categories: diagnostic; therapeutic; and/or  palliative. Diagnostic benefits include: getting information which can ultimately lead to improvement of the disease or symptom(s). Therapeutic benefits are those associated with the successful treatment of the disease. Finally, palliative benefits are those related to the decrease of the primary symptoms, without necessarily curing the condition (example: decreasing the pain from a flare-up of a chronic condition, such as incurable terminal cancer).  General Risks and Complications: These are associated to most interventional treatments. They can occur alone, or in combination. They fall under one of the following six (6) categories: no benefit or worsening of symptoms; bleeding; infection; nerve damage; allergic reactions; and/or death. No benefits or worsening of symptoms: In Medicine there are no guarantees, only probabilities. No healthcare provider can ever guarantee that a medical treatment will work, they can only state the probability that it may. Furthermore, there is always the possibility that the condition may worsen, either directly, or indirectly, as a consequence of the treatment. Bleeding: This is more common if the patient is taking a blood thinner, either prescription or over the counter (example: Goody Powders, Fish oil, Aspirin, Garlic, etc.), or if suffering a condition associated with impaired coagulation (example: Hemophilia, cirrhosis of the liver, low platelet counts, etc.). However, even if you do not have one on these, it can still happen. If you have any of these conditions, or take one of these drugs, make sure to notify your treating physician. Infection: This is more common in patients with a compromised immune system, either due to disease (example: diabetes, cancer, human immunodeficiency virus [HIV], etc.), or due to medications or treatments (example: therapies used to treat cancer and rheumatological diseases). However, even if you do not have one on these, it can still  happen. If you have any of these conditions, or take one of these drugs, make sure to notify your treating physician. Nerve Damage: This is more common when the treatment is an invasive one, but it can also happen with the use of medications, such as those used in the treatment of cancer. The damage can occur to small secondary nerves, or to large primary ones, such as those in the spinal cord and brain. This damage may be temporary or permanent and it may lead to impairments that can range from temporary numbness to permanent paralysis and/or brain death. Allergic Reactions: Any time a substance or material comes in contact with our body, there is the possibility of an allergic reaction. These can range from a mild skin rash (contact dermatitis) to a severe systemic reaction (anaphylactic reaction), which can result in death. Death: In general, any medical intervention can result in death, most of the time due to an unforeseen complication. ______________________________________________________________________      ______________________________________________________________________    Steroid injections  Common steroids for injections Triamcinolone: Used by many sports medicine physicians for large joint and bursal injections, often combined with a local anesthetic like lidocaine . A study focusing on coccydynia (tailbone pain) found triamcinolone was more effective than betamethasone , suggesting it may also be preferable for other localized inflammation conditions. Methylprednisolone: A common alternative to triamcinolone that is also a strong anti-inflammatory. It is available in different formulations, with the acetate suspension being the long-acting option for intra-articular injections. Dexamethasone : This is a non-particulate steroid, meaning it has a lower risk of tissue damage compared to particulate steroids like triamcinolone and methylprednisolone. While less common for this specific  use,  it is an option for targeted injections.   Considerations for physicians Particulate vs. non-particulate steroids: Triamcinolone and methylprednisolone are particulate, meaning they can clump together. Dexamethasone  is non-particulate. Particulate steroids are often preferred for their longer-lasting effects but carry a theoretical higher risk for certain injections (though this is less of a concern in the costochondral joints). Combined injectate: Corticosteroids are typically mixed with a local anesthetic like lidocaine  to provide both immediate pain relief (from the anesthetic) and longer-term inflammation reduction (from the steroid). Imaging guidance: To ensure accurate placement of the needle and medication, physicians may use ultrasound or fluoroscopic guidance for the injection, especially in complex or refractory cases.   Patient guidance Before undergoing a steroid injection, discuss the options with your physician. They will determine the best steroid, dosage, and procedure for your specific case based on factors like: Severity of your condition History of response to other treatments Your overall health status Experience and preference of the physician  Last  Updated: 05/12/2024 ______________________________________________________________________

## 2024-06-18 ENCOUNTER — Ambulatory Visit
Admission: RE | Admit: 2024-06-18 | Discharge: 2024-06-18 | Disposition: A | Discharge status: Home / Self Care | Source: Ambulatory Visit | Attending: Pain Medicine | Admitting: Pain Medicine

## 2024-06-18 ENCOUNTER — Encounter: Payer: Self-pay | Admitting: Pain Medicine

## 2024-06-18 ENCOUNTER — Ambulatory Visit (HOSPITAL_BASED_OUTPATIENT_CLINIC_OR_DEPARTMENT_OTHER): Admitting: Pain Medicine

## 2024-06-18 VITALS — BP 108/81 | HR 64 | Temp 97.3°F | Resp 15 | Ht 64.0 in | Wt 176.0 lb

## 2024-06-18 DIAGNOSIS — M25512 Pain in left shoulder: Secondary | ICD-10-CM | POA: Diagnosis present

## 2024-06-18 DIAGNOSIS — M5412 Radiculopathy, cervical region: Secondary | ICD-10-CM | POA: Insufficient documentation

## 2024-06-18 DIAGNOSIS — M4802 Spinal stenosis, cervical region: Secondary | ICD-10-CM

## 2024-06-18 DIAGNOSIS — M79601 Pain in right arm: Secondary | ICD-10-CM | POA: Diagnosis present

## 2024-06-18 DIAGNOSIS — M549 Dorsalgia, unspecified: Secondary | ICD-10-CM | POA: Insufficient documentation

## 2024-06-18 DIAGNOSIS — M47812 Spondylosis without myelopathy or radiculopathy, cervical region: Secondary | ICD-10-CM | POA: Diagnosis present

## 2024-06-18 DIAGNOSIS — M542 Cervicalgia: Secondary | ICD-10-CM | POA: Insufficient documentation

## 2024-06-18 DIAGNOSIS — M5013 Cervical disc disorder with radiculopathy, cervicothoracic region: Secondary | ICD-10-CM | POA: Insufficient documentation

## 2024-06-18 DIAGNOSIS — M25511 Pain in right shoulder: Secondary | ICD-10-CM | POA: Diagnosis present

## 2024-06-18 DIAGNOSIS — R937 Abnormal findings on diagnostic imaging of other parts of musculoskeletal system: Secondary | ICD-10-CM | POA: Diagnosis present

## 2024-06-18 DIAGNOSIS — G8929 Other chronic pain: Secondary | ICD-10-CM | POA: Insufficient documentation

## 2024-06-18 MED ORDER — ROPIVACAINE HCL 2 MG/ML IJ SOLN
1.0000 mL | Freq: Once | INTRAMUSCULAR | Status: DC
  Filled 2024-06-18: qty 20

## 2024-06-18 MED ORDER — DEXAMETHASONE SODIUM PHOSPHATE 10 MG/ML IJ SOLN
10.0000 mg | Freq: Once | INTRAMUSCULAR | Status: AC
  Administered 2024-06-18: 10 mg
  Filled 2024-06-18: qty 1

## 2024-06-18 MED ORDER — PENTAFLUOROPROP-TETRAFLUOROETH EX AERO: INHALATION_SPRAY | Freq: Once | CUTANEOUS | Status: DC

## 2024-06-18 MED ORDER — SODIUM CHLORIDE 0.9% FLUSH: 1.0000 mL | Freq: Once | INTRAVENOUS | Status: DC

## 2024-06-18 MED ORDER — LIDOCAINE HCL 2 % IJ SOLN
20.0000 mL | Freq: Once | INTRAMUSCULAR | Status: DC
  Filled 2024-06-18: qty 20

## 2024-06-18 MED ORDER — MIDAZOLAM HCL 2 MG/2ML IJ SOLN
0.5000 mg | Freq: Once | INTRAMUSCULAR | Status: DC
  Filled 2024-06-18: qty 2

## 2024-06-18 MED ORDER — IOHEXOL 180 MG/ML  SOLN
10.0000 mL | Freq: Once | INTRAMUSCULAR | Status: AC
  Administered 2024-06-18: 10 mL via EPIDURAL
  Filled 2024-06-18: qty 20

## 2024-06-18 NOTE — Progress Notes (Signed)
 PROVIDER NOTE: Interpretation of information contained herein should be left to medically-trained personnel. Specific patient instructions are provided elsewhere under Patient Instructions section of medical record. This document was created in part using STT-dictation technology, any transcriptional errors that may result from this process are unintentional.  Patient: Heather Curry Type: Established DOB: [birth year] MRN: [id number] PCP: Emilio Kelly DASEN, FNP (Inactive)  Service: Procedure DOS: 06/18/2024 Setting: Ambulatory Location: Ambulatory outpatient facility Delivery: Face-to-face Provider: Eric DELENA Como, MD Specialty: Interventional Pain Management Specialty designation: 09 Location: Outpatient facility Ref. Prov.: No ref. provider found       Interventional Therapy   Type: Cervical Epidural Steroid injection (CESI) (Interlaminar) #2  Laterality: Right (-RT)  Level: C7-T1 DOS: 06/18/2024  Provider: Eric DELENA Como, MD Imaging: Fluoroscopy-guided Spinal ([id number]) Anesthesia: Local anesthesia (1-2% Lidocaine ) Anxiolysis: IV Versed          Sedation: No Sedation                        Medical Necessity Purpose: Diagnostic/Therapeutic Rationale (medical necessity): procedure needed and proper for the diagnosis and/or treatment of Heather Curry's medical symptoms and needs. Indications: Cervicalgia, cervical radicular pain, degenerative disc disease, severe enough to impact quality of life or function. 1. Cervicalgia   2. Cervical spinal stenosis (C5-6)   3. Cervical radiculopathy at C8 (Right)   4. Cervical radiculopathy at C7 (Right)   5. Cervical radiculopathy (Right)   6. 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 Cervical facet joint pain   8. Cervical facet hypertrophy (Multilevel) (Bilateral)   9. Cervical facet arthropathy   10. Cervical disc disorder with radiculopathy, cervicothoracic region   11. Chronic neck and back pain (1ry area  of Pain)   12. Chronic shoulder pain (2ry area of Pain) (Bilateral)   13. Chronic upper extremity pain (3ry area of Pain) (Right)   14. Chronic upper back pain   15. Abnormal MRI, cervical spine (09/12/2023)    NAS-11 Pain score:   Pre-procedure: 8 /10   Post-procedure: 6 /10     Position  Prep  Materials:  Location setting: Procedure suite Position: Prone, on modified reverse trendelenburg to facilitate breathing, with head in head-cradle. Pillows positioned under chest (below chin-level) with cervical spine flexed. Safety Precautions: Patient was assessed for positional comfort and pressure points before starting the procedure. Prepping solution: DuraPrep (Iodine Povacrylex [0.7% available iodine] and Isopropyl Alcohol, 74% w/w) Prep Area: Entire  cervicothoracic region Approach: percutaneous, paramedial Intended target: Posterior cervical epidural space Materials Procedure:  Tray: Epidural Needle(s): Epidural (Tuohy) Qty: 1 Length: (90mm) 3.5-inch Gauge: 17G  H&P (Pre-op Assessment):  Heather Curry is a 58 y.o. (year old), female patient, seen today for interventional treatment. She  has a past surgical history that includes Breast surgery (Left); Appendectomy; Cervix removal; Colonoscopy with propofol  (N/A, 04/18/2018); Colonoscopy with propofol  (N/A, 04/17/2019); Colonoscopy with propofol  (N/A, 06/01/2020); Breast biopsy (Right, 2007); Breast biopsy (Right, 09/27/2020); and Colonoscopy with propofol  (N/A, 06/01/2022). Heather Curry has a current medication list which includes the following prescription(s): aspirin ec, budesonide-formoterol, bupropion, diclofenac sodium, escitalopram, gabapentin, hydrochlorothiazide , levothyroxine , meloxicam , montelukast , rosuvastatin, and tizanidine , and the following Facility-Administered Medications: lidocaine , midazolam , pentafluoroprop-tetrafluoroeth, ropivacaine  (pf) 2 mg/ml (0.2%), and sodium chloride  flush. Her primarily concern today is the Back  Pain (upper)  Initial Vital Signs:  Pulse/HCG Rate: 64ECG Heart Rate: 63 Temp: (!) 97.3 F (36.3 C) Resp: 14 BP: (!) 130/91 SpO2: 96 %  BMI: Estimated body mass index  is 30.21 kg/m as calculated from the following:   Height as of this encounter: 5' 4 (1.626 m).   Weight as of this encounter: 176 lb (79.8 kg).  Risk Assessment: Allergies: Reviewed. She is allergic to bee venom and penicillins.  Allergy Precautions: None required Coagulopathies: Reviewed. None identified.  Blood-thinner therapy: None at this time Active Infection(s): Reviewed. None identified. Heather Curry is afebrile  Site Confirmation: Heather Curry was asked to confirm the procedure and laterality before marking the site Procedure checklist: Completed Consent: Before the procedure and under the influence of no sedative(s), amnesic(s), or anxiolytics, the patient was informed of the treatment options, risks and possible complications. To fulfill our ethical and legal obligations, as recommended by the American Medical Association's Code of Ethics, I have informed the patient of my clinical impression; the nature and purpose of the treatment or procedure; the risks, benefits, and possible complications of the intervention; the alternatives, including doing nothing; the risk(s) and benefit(s) of the alternative treatment(s) or procedure(s); and the risk(s) and benefit(s) of doing nothing. The patient was provided information about the general risks and possible complications associated with the procedure. These may include, but are not limited to: failure to achieve desired goals, infection, bleeding, organ or nerve damage, allergic reactions, paralysis, and death. In addition, the patient was informed of those risks and complications associated to Spine-related procedures, such as failure to decrease pain; infection (i.e.: Meningitis, epidural or intraspinal abscess); bleeding (i.e.: epidural hematoma, subarachnoid hemorrhage,  or any other type of intraspinal or peri-dural bleeding); organ or nerve damage (i.e.: Any type of peripheral nerve, nerve root, or spinal cord injury) with subsequent damage to sensory, motor, and/or autonomic systems, resulting in permanent pain, numbness, and/or weakness of one or several areas of the body; allergic reactions; (i.e.: anaphylactic reaction); and/or death. Furthermore, the patient was informed of those risks and complications associated with the medications. These include, but are not limited to: allergic reactions (i.e.: anaphylactic or anaphylactoid reaction(s)); adrenal axis suppression; blood sugar elevation that in diabetics may result in ketoacidosis or comma; water retention that in patients with history of congestive heart failure may result in shortness of breath, pulmonary edema, and decompensation with resultant heart failure; weight gain; swelling or edema; medication-induced neural toxicity; particulate matter embolism and blood vessel occlusion with resultant organ, and/or nervous system infarction; and/or aseptic necrosis of one or more joints. Finally, the patient was informed that Medicine is not an exact science; therefore, there is also the possibility of unforeseen or unpredictable risks and/or possible complications that may result in a catastrophic outcome. The patient indicated having understood very clearly. We have given the patient no guarantees and we have made no promises. Enough time was given to the patient to ask questions, all of which were answered to the patient's satisfaction. Ms. Delcastillo has indicated that she wanted to continue with the procedure. Attestation: I, the ordering provider, attest that I have discussed with the patient the benefits, risks, side-effects, alternatives, likelihood of achieving goals, and potential problems during recovery for the procedure that I have provided informed consent. Date  Time: 06/18/2024  1:22 PM  Pre-Procedure  Preparation:  Monitoring: As per clinic protocol. Respiration, ETCO2, SpO2, BP, heart rate and rhythm monitor placed and checked for adequate function Safety Precautions: Patient was assessed for positional comfort and pressure points before starting the procedure. Time-out: I initiated and conducted the Time-out before starting the procedure, as per protocol. The patient was asked to participate by confirming the  accuracy of the Time Out information. Verification of the correct person, site, and procedure were performed and confirmed by me, the nursing staff, and the patient. Time-out conducted as per Joint Commission's Universal Protocol (UP.01.01.01). Time: 1352 Start Time: 1352 (C7t1 right 7 cm from the skin) hrs.  Description  Narrative of Procedure:          Rationale (medical necessity): procedure needed and proper for the diagnosis and/or treatment of the patient's medical symptoms and needs. Start Time: 1352 (C7t1 right 7 cm from the skin) hrs. Safety Precautions: Aspiration looking for blood return was conducted prior to all injections. At no point did we inject any substances, as a needle was being advanced. No attempts were made at seeking any paresthesias. Safe injection practices and needle disposal techniques used. Medications properly checked for expiration dates. SDV (single dose vial) medications used. Description of procedure: Protocol guidelines were followed. The patient was assisted into a comfortable position. The target area was identified and the area prepped in the usual manner. Skin & deeper tissues infiltrated with local anesthetic. Appropriate amount of time allowed to pass for local anesthetics to take effect. Using fluoroscopic guidance, the epidural needle was introduced through the skin, ipsilateral to the reported pain, and advanced to the target area. Posterior laminar os was contacted and the needle walked caudad, until the lamina was cleared. The ligamentum  flavum was engaged and the epidural space identified using "loss-of-resistance technique" with 2-3 ml of PF-NaCl (0.9% NSS), in a 5cc dedicated LOR syringe. (See Imaging guidance below for use of contrast details.) Once proper needle placement was secured, and negative aspiration confirmed, the solution was injected in intermittent fashion, asking for systemic symptoms every 0.5cc. The needles were then removed and the area cleansed, making sure to leave some of the prepping solution back to take advantage of its long term bactericidal properties.  Vitals:   06/18/24 1320 06/18/24 1350 06/18/24 1355 06/18/24 1400  BP: (!) 130/91 110/83 110/82 108/81  Pulse: 64     Resp: 14 14 16 15   Temp: (!) 97.3 F (36.3 C)     TempSrc: Temporal     SpO2: 96% 96% 98% 98%  Weight: 176 lb (79.8 kg)     Height: 5' 4 (1.626 m)        End Time: 1357 hrs.  Imaging Guidance (Spinal):          Type of Imaging Technique: Fluoroscopy Guidance (Spinal) Indication(s): Fluoroscopy guidance for needle placement to enhance accuracy in procedures requiring precise needle localization for targeted delivery of medication in or near specific anatomical locations not easily accessible without such real-time imaging assistance. Exposure Time: Please see nurses notes. Contrast: Before injecting any contrast, we confirmed that the patient did not have an allergy to iodine, shellfish, or radiological contrast. Once satisfactory needle placement was completed at the desired level, radiological contrast was injected. Contrast injected under live fluoroscopy. No contrast complications. See chart for type and volume of contrast used. Fluoroscopic Guidance: I was personally present during the use of fluoroscopy. Tunnel Vision Technique used to obtain the best possible view of the target area. Parallax error corrected before commencing the procedure. Direction-depth-direction technique used to introduce the needle under continuous  pulsed fluoroscopy. Once target was reached, antero-posterior, oblique, and lateral fluoroscopic projection used confirm needle placement in all planes. Images permanently stored in EMR. Interpretation: I personally interpreted the imaging intraoperatively. Adequate needle placement confirmed in multiple planes. Appropriate spread of contrast into desired area was  observed. No evidence of afferent or efferent intravascular uptake. No intrathecal or subarachnoid spread observed. Permanent images saved into the patient's record.  Post-operative Assessment:  Post-procedure Vital Signs:  Pulse/HCG Rate: 6464 Temp: (!) 97.3 F (36.3 C) Resp: 15 BP: 108/81 SpO2: 98 %  EBL: None  Complications: No immediate post-treatment complications observed by team, or reported by patient.  Note: The patient tolerated the entire procedure well. A repeat set of vitals were taken after the procedure and the patient was kept under observation following institutional policy, for this type of procedure. Post-procedural neurological assessment was performed, showing return to baseline, prior to discharge. The patient was provided with post-procedure discharge instructions, including a section on how to identify potential problems. Should any problems arise concerning this procedure, the patient was given instructions to immediately contact us , at any time, without hesitation. In any case, we plan to contact the patient by telephone for a follow-up status report regarding this interventional procedure.  Comments:  No additional relevant information.  Plan of Care (POC)  Orders:  Orders Placed This Encounter  Procedures   Cervical Epidural Injection    Indication(s): Radiculitis and cervicalgia associated with cervical degenerative disc disease. Position: Prone Imaging guidance: Fluoroscopy required. Contrast required unless contraindicated by allergy or severe CKD. Equipment & Materials: Epidural tray & needle.     Scheduling Instructions:     Procedure: Cervical Epidural Steroid Injection/Block     Planned Level(s): C7-T1     Laterality: Right-sided     Anxiolysis: Patient's choice.     Date: 06/18/2024    Where will this procedure be performed?:   [facility]             by Dr. [facility] 1-60 MIN NO REPORT    Intraoperative interpretation by procedural physician at [facility].    Standing Status:   Standing    Number of Occurrences:   1    Reason for exam::   Assistance in needle guidance and placement for procedures requiring needle placement in or near specific anatomical locations not easily accessible without such assistance.   Informed Consent Details: Physician/Practitioner Attestation; Transcribe to consent form and obtain patient signature    Nursing instructions: Transcribe to consent form and obtain patient signature. Always confirm laterality of pain with Ms. Lindquist, before procedure.    Physician/Practitioner attestation of informed consent for procedure/surgical case:   I, the physician/practitioner, attest that I have discussed with the patient the benefits, risks, side effects, alternatives, likelihood of achieving goals and potential problems during recovery for the procedure that I have provided informed consent.    Procedure:   Cervical Epidural Steroid Injection (CESI) under fluoroscopic guidance    Physician/Practitioner performing the procedure:   Mahkayla Preece A. Tanya MD    Indication/Reason:   Indications: Cervicalgia (neck pain), cervical radicular pain, radiculitis (arm/shoulder pain, numbness, and/or weakness), degenerative disc disease, severe enough to greatly impact quality of life or function.   Provide equipment / supplies at bedside    Procedural tray: Epidural Tray (Disposable  single use) Skin infiltration needle: Regular 1.5-in, 25-G, (x1) Block needle size: Regular standard Catheter: No catheter required    Standing Status:    Standing    Number of Occurrences:   1    Specify:   Epidural Tray   Curry lock IV    Have LR [phone] mL available and administer at 125 mL/hr if patient becomes hypotensive.    Standing Status:  Standing    Number of Occurrences:   1     Opioid Analgesic: None MME/day: 0 mg/day    Medications ordered for procedure: Meds ordered this encounter  Medications   iohexol  (OMNIPAQUE ) 180 MG/ML injection 10 mL    Must be Myelogram-compatible. If not available, you may substitute with a water-soluble, non-ionic, hypoallergenic, myelogram-compatible radiological contrast medium.   lidocaine  (XYLOCAINE ) 2 % (with pres) injection 400 mg   pentafluoroprop-tetrafluoroeth (GEBAUERS) aerosol   midazolam  (VERSED ) injection 0.5-2 mg    Make sure Flumazenil is available in the pyxis when using this medication. If oversedation occurs, administer 0.2 mg IV over 15 sec. If after 45 sec no response, administer 0.2 mg again over 1 min; may repeat at 1 min intervals; not to exceed 4 doses (1 mg)   sodium chloride  flush (NS) 0.9 % injection 1 mL   ropivacaine  (PF) 2 mg/mL (0.2%) (NAROPIN ) injection 1 mL   dexamethasone  (DECADRON ) injection 10 mg   Medications administered: We administered iohexol  and dexamethasone .  See the medical record for exact dosing, route, and time of administration.    Interventional Therapies  Risk Factors  Considerations  Medical Comorbidities:  ALLERGY: PCN  (04/14/2024) UDS (+) THC (Marijuana use)  BA  Bradycardia  HTN  PTSD  Depression     Planned  Pending:   Diagnostic/therapeutic right cervical ESI #2    Under consideration:   Diagnostic/therapeutic right interlaminar cervical ESI #2  Diagnostic bilateral cervical facet MBB #1 w/ possible RFA follow-up Diagnostic bilateral IA shoulder joint inj. #1  Diagnostic/therapeutic right T12-L1 LESI #1  Diagnostic/therapeutic left L2-3 LESI #1  Diagnostic/therapeutic midline L2-3 LESI #1  Diagnostic/therapeutic  midline L3-4 LESI #1  Diagnostic/therapeutic bilateral L2 TFESI #1  Diagnostic/therapeutic bilateral L3 TFESI #1  Diagnostic bilateral lumbar facet MBB #1 w/ possible RFA follow-up   Completed: (Analgesic benefit)1  Diagnostic/therapeutic right IL cervical ESI x1 (05/19/2024) (100/100/80/40)    Therapeutic  Palliative (PRN) options:   None established   Completed by other providers:   Diagnostic/therapeutic right C5-6 TFESI x1 (10/11/2023) by Morene Falcon, DO [facility])  Diagnostic/therapeutic bilateral L3-4 TFESI x1 (07/12/2023) by Morene Falcon, DO ([facility])   1(Analgesic benefit): Expressed in percentage (%). (Local anesthetic[LA] +/- sedation  L.A.Local Anesthetic  Steroid benefit  Ongoing benefit)      Follow-up plan:   Return in about 2 weeks (around 07/02/2024) for (Face2F), (PPE).     Recent Visits Date Type Provider Dept  06/18/24 Procedure visit Tanya Glisson, MD [facility]  06/08/24 Office Visit Tanya Glisson, MD [facility]  05/19/24 Procedure visit Tanya Glisson, MD [facility]  05/11/24 Office Visit Tanya Glisson, MD [facility]  04/14/24 Office Visit Tanya Glisson, MD [facility]  Showing recent visits within past 90 days and meeting all other requirements Future Appointments Date Type Provider Dept  07/06/24 Appointment Tanya Glisson, MD [facility]  Showing future appointments within next 90 days and meeting all other requirements   Disposition: Discharge home  Discharge (Date  Time): 06/18/2024; 1409 hrs.   Primary Care Physician: Emilio Kelly DASEN, FNP (Inactive) Location: [facility] Note by: Glisson DELENA Tanya, MD (TTS technology used. I apologize for any typographical errors that were not detected and corrected.) Date: 06/18/2024; Time: 4:34 PM  Disclaimer:  Medicine is not an Visual merchandiser. The only guarantee in medicine is  that nothing is guaranteed. It is important to note that the decision to proceed with  this intervention was based on the information collected from the patient. The Data and conclusions were drawn from the patient's questionnaire, the interview, and the physical examination. Because the information was provided in large part by the patient, it cannot be guaranteed that it has not been purposely or unconsciously manipulated. Every effort has been made to obtain as much relevant data as possible for this evaluation. It is important to note that the conclusions that lead to this procedure are derived in large part from the available data. Always take into account that the treatment will also be dependent on availability of resources and existing treatment guidelines, considered by other Pain Management Practitioners as being common knowledge and practice, at the time of the intervention. For Medico-Legal purposes, it is also important to point out that variation in procedural techniques and pharmacological choices are the acceptable norm. The indications, contraindications, technique, and results of the above procedure should only be interpreted and judged by a Board-Certified Interventional Pain Specialist with extensive familiarity and expertise in the same exact procedure and technique.

## 2024-06-18 NOTE — Patient Instructions (Signed)

## 2024-06-19 ENCOUNTER — Telehealth: Payer: Self-pay | Admitting: *Deleted

## 2024-06-19 NOTE — Telephone Encounter (Signed)
 No problems post procedure.

## 2024-07-01 ENCOUNTER — Encounter: Payer: Managed Care, Other (non HMO) | Admitting: Family Medicine

## 2024-07-03 ENCOUNTER — Encounter: Admitting: Family Medicine

## 2024-07-05 NOTE — Progress Notes (Unsigned)
 PROVIDER NOTE: Interpretation of information contained herein should be left to medically-trained personnel. Specific patient instructions are provided elsewhere under Patient Instructions section of medical record. This document was created in part using AI and STT-dictation technology, any transcriptional errors that may result from this process are unintentional.  Patient: Heather Curry  Service: E/M   PCP: Emilio Kelly DASEN, FNP (Inactive)  DOB: [birth year]  DOS: 07/06/2024  Provider: Eric DELENA Como, MD  MRN: [id number]  Delivery: Face-to-face  Specialty: Interventional Pain Management  Type: Established Patient  Setting: Ambulatory outpatient facility  Specialty designation: 09  Referring Prov.: No ref. provider found  Location: Outpatient office facility       History of present illness (HPI) [name], a 58 y.o. year old female, is here today because of her Cervicalgia [M54.2]. [name] primary complain today is No chief complaint on file.  Pertinent problems: 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, L3-4); Chronic pain syndrome; Abnormal MRI, lumbar spine (05/04/2023); Abnormal MRI, cervical spine (09/12/2023); Cervicalgia; Chronic neck and back pain (1ry area of Pain); Chronic shoulder pain (2ry area of Pain) (Bilateral); Chronic upper extremity pain (3ry area of Pain) (Right); Cervical radiculopathy at C7 (Right); Cervical radiculopathy at C8 (Right); Cervical radiculopathy (Right); Cervical disc disorder with radiculopathy, cervicothoracic region; Radicular pain of shoulder (Bilateral); Chronic low back pain (4th area of Pain) (Bilateral) w/ sciatica (Bilateral); Chronic low back pain (Bilateral) w/o sciatica; Chronic hip pain (Bilateral); Chronic lower extremity pain (5th area of Pain) (Bilateral); Chronic lower extremity radicular pain (Bilateral); Lumbar facet joint pain; Cervical facet joint  pain; Lumbar radiculitis (Bilateral); Lumbosacral radiculopathy at L5 (Bilateral); Lumbosacral radiculopathy at S1 (Bilateral); Chronic upper back pain; Chronic thoracic back pain (Bilateral); Cervical facet arthropathy; Cervical facet joint syndrome; DDD (degenerative disc disease), cervical; 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, 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); Cervical spinal stenosis (C5-6); Chronic sacroiliac joint pain (Right); Thoracolumbar spine levoscoliosis; Lumbar facet arthropathy (Multilevel) (Bilateral); DDD (degenerative disc disease), lumbar w/ LEP; Lumbar intervertebral disc protrusion (Right: T12-L1) (Left: L2-3); 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, L3-4); Lumbar spondylosis; Low back pain of over 3 months duration; Low back pain potentially associated with radiculopathy; Low back pain radiating to legs (Bilateral); Multifactorial low back pain; Osteoarthritis of hips (Bilateral); Osteoarthritis of acromioclavicular joints (Bilateral); Osteoarthritis of glenohumeral joint (Right); Osteoarthritis of shoulders (Bilateral); DDD (degenerative disc disease), thoracic; DDD (degenerative disc disease), lumbar; Cervical facet hypertrophy (Multilevel) (Bilateral); Lumbar facet hypertrophy (Multilevel) (Bilateral); Neck pain of over 3 months duration; and Neck pain on right side on their pertinent problem list.  Pain Assessment: Severity of   is reported as a  /10. Location:    / . Onset:  . Quality:  . Timing:  . Modifying factor(s):  SABRA Vitals:  vitals were not taken for this visit.  BMI: Estimated body mass index is 30.21 kg/m as calculated from the following:   Height as of 06/18/24: 5' 4 (1.626 m).   Weight as of 06/18/24: 176 lb (79.8 kg).  Last encounter: 06/08/2024. Last procedure: 06/18/2024.  Reason for encounter: post-procedure evaluation and assessment.   Discussed the use of AI scribe software for clinical note transcription with the patient, who gave verbal consent to  proceed.  History of Present Illness          Post-Procedure Evaluation   Type: Cervical Epidural Steroid injection (CESI) (Interlaminar) #2  Laterality: Right (-RT)  Level: C7-T1 DOS: 06/18/2024  Provider: Eric  DELENA Como, MD Imaging: Fluoroscopy-guided Spinal ([id number]) Anesthesia: Local anesthesia (1-2% Lidocaine ) Anxiolysis: IV Versed          Sedation: No Sedation                        Medical Necessity Purpose: Diagnostic/Therapeutic Rationale (medical necessity): procedure needed and proper for the diagnosis and/or treatment of [name] medical symptoms and needs. Indications: Cervicalgia, cervical radicular pain, degenerative disc disease, severe enough to impact quality of life or function. 1. Cervicalgia   2. Cervical spinal stenosis (C5-6)   3. Cervical radiculopathy at C8 (Right)   4. Cervical radiculopathy at C7 (Right)   5. Cervical radiculopathy (Right)   6. 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 Cervical facet joint pain   8. Cervical facet hypertrophy (Multilevel) (Bilateral)   9. Cervical facet arthropathy   10. Cervical disc disorder with radiculopathy, cervicothoracic region   11. Chronic neck and back pain (1ry area of Pain)   12. Chronic shoulder pain (2ry area of Pain) (Bilateral)   13. Chronic upper extremity pain (3ry area of Pain) (Right)   14. Chronic upper back pain   15. Abnormal MRI, cervical spine (09/12/2023)    NAS-11 Pain score:   Pre-procedure: 8 /10   Post-procedure: 6 /10     Effectiveness:  Initial hour after procedure:   ***. Subsequent 4-6 hours post-procedure:   ***. Analgesia past initial 6 hours:   ***. Ongoing improvement:  Analgesic:  *** Function:    ***    ROM:    ***     Pharmacotherapy Assessment   Opioid Analgesic: None MME/day: 0 mg/day   Monitoring: [place] PMP: PDMP reviewed during this encounter.       Pharmacotherapy: No side-effects or adverse reactions  reported. Compliance: No problems identified. Effectiveness: Clinically acceptable.  No notes on file  UDS:  Summary  Date Value Ref Range Status  04/14/2024 FINAL  Final    Comment:    ==================================================================== Compliance Drug Analysis, Ur ==================================================================== Test                             Result       Flag       Units  Drug Present and Declared for Prescription Verification   Gabapentin                     PRESENT      EXPECTED   Bupropion                      PRESENT      EXPECTED   Hydroxybupropion               PRESENT      EXPECTED    Hydroxybupropion is an expected metabolite of bupropion.  Drug Present not Declared for Prescription Verification   Carboxy-THC                    155          UNEXPECTED ng/mg creat    Carboxy-THC is a metabolite of tetrahydrocannabinol (THC). Source of    THC is most commonly herbal marijuana or marijuana-based products,    but THC is also present in a scheduled prescription medication.    Trace amounts of THC can be present in hemp and cannabidiol (CBD)    products. This  test is not intended to distinguish between delta-9-    tetrahydrocannabinol, the predominant form of THC in most herbal or    marijuana-based products, and delta-8-tetrahydrocannabinol.  Drug Absent but Declared for Prescription Verification   Tizanidine                      Not Detected UNEXPECTED    Tizanidine , as indicated in the declared medication list, is not    always detected even when used as directed.    Diclofenac                     Not Detected UNEXPECTED    Diclofenac, as indicated in the declared medication list, is not    always detected even when used as directed.    Salicylate                     Not Detected UNEXPECTED    Aspirin, as indicated in the declared medication list, is not always    detected even when used as  directed.  ==================================================================== Test                      Result    Flag   Units      Ref Range   Creatinine              120              mg/dL      >=79 ==================================================================== Declared Medications:  The flagging and interpretation on this report are based on the  following declared medications.  Unexpected results may arise from  inaccuracies in the declared medications.   **Note: The testing scope of this panel includes these medications:   Bupropion (Wellbutrin XL)  Gabapentin (Neurontin)   **Note: The testing scope of this panel does not include small to  moderate amounts of these reported medications:   Aspirin  Diclofenac (Voltaren)  Tizanidine  (Zanaflex )   **Note: The testing scope of this panel does not include the  following reported medications:   Budesonide (Symbicort)  Formoterol (Symbicort)  Hydrochlorothiazide  (Hydrodiuril )  Levothyroxine  (Synthroid )  Meloxicam  (Mobic )  Montelukast  (Singulair )  Rosuvastatin (Crestor) ==================================================================== For clinical consultation, please call [phone]. ====================================================================     No results found for: CBDTHCR No results found for: D8THCCBX No results found for: D9THCCBX  ROS  Constitutional: Denies any fever or chills Gastrointestinal: No reported hemesis, hematochezia, vomiting, or acute GI distress Musculoskeletal: Denies any acute onset joint swelling, redness, loss of ROM, or weakness Neurological: No reported episodes of acute onset apraxia, aphasia, dysarthria, agnosia, amnesia, paralysis, loss of coordination, or loss of consciousness  Medication Review  aspirin EC, buPROPion, budesonide-formoterol, diclofenac Sodium, escitalopram, gabapentin, hydrochlorothiazide , levothyroxine , meloxicam , montelukast , rosuvastatin,  and tiZANidine   History Review  Allergy: [name] is allergic to bee venom and penicillins. Drug: [name]  reports no history of drug use. Alcohol:  reports current alcohol use. Tobacco:  reports that she quit smoking about 16 years ago. Her smoking use included cigarettes. She has never used smokeless tobacco. Social: [name]  reports that she quit smoking about 16 years ago. Her smoking use included cigarettes. She has never used smokeless tobacco. She reports current alcohol use. She reports that she does not use drugs. Medical:  has a past medical history of Colon polyps and Hypertension. Surgical: [name]  has a past surgical history that includes Breast surgery (Left); Appendectomy; Cervix removal;  Colonoscopy with propofol  (N/A, 04/18/2018); Colonoscopy with propofol  (N/A, 04/17/2019); Colonoscopy with propofol  (N/A, 06/01/2020); Breast biopsy (Right, 2007); Breast biopsy (Right, 09/27/2020); and Colonoscopy with propofol  (N/A, 06/01/2022). Family: family history includes Alcohol abuse in her father; Arthritis in her maternal grandmother and mother; Breast cancer (age of onset: 85) in her maternal aunt; COPD in her father; Cancer in her maternal grandmother; Congestive Heart Failure in her mother; Hypertension in her father, mother, and sister.  Laboratory Chemistry Profile   Renal Lab Results  Component Value Date   BUN 13 04/14/2024   CREATININE 0.95 04/14/2024   BCR 14 04/14/2024   GFRAA 100 05/24/2020   GFRNONAA 87 05/24/2020    Hepatic Lab Results  Component Value Date   AST 22 04/14/2024   ALT 45 (H) 09/20/2023   ALBUMIN 4.6 04/14/2024   ALKPHOS 175 (H) 04/14/2024    Electrolytes Lab Results  Component Value Date   NA 139 04/14/2024   K 4.2 04/14/2024   CL 100 04/14/2024   CALCIUM 10.6 (H) 04/14/2024   MG 2.2 04/14/2024    Bone Lab Results  Component Value Date   VD25OH 20.5 (L) 07/01/2023   25OHVITD1 32 04/14/2024   25OHVITD2 <1.0 04/14/2024    25OHVITD3 32 04/14/2024    Inflammation (CRP: Acute Phase) (ESR: Chronic Phase) Lab Results  Component Value Date   CRP <1 04/14/2024   ESRSEDRATE 13 04/14/2024         Note: Above Lab results reviewed.  Recent Imaging Review  DG PAIN CLINIC C-ARM 1-60 MIN NO REPORT Fluoro was used, but no Radiologist interpretation will be provided.  Please refer to NOTES tab for provider progress note. Note: Reviewed        Physical Exam  Vitals: There were no vitals taken for this visit. BMI: Estimated body mass index is 30.21 kg/m as calculated from the following:   Height as of 06/18/24: 5' 4 (1.626 m).   Weight as of 06/18/24: 176 lb (79.8 kg). Ideal: Patient weight not recorded General appearance: Well nourished, well developed, and well hydrated. In no apparent acute distress Mental status: Alert, oriented x 3 (person, place, & time)       Respiratory: No evidence of acute respiratory distress Eyes: PERLA   Assessment   Diagnosis Status  1. Cervicalgia   2. Cervical radiculopathy at C8 (Right)   3. Cervical radiculopathy at C7 (Right)   4. Cervical radiculopathy (Right)   5. Cervical facet joint pain   6. Postop check    Controlled Controlled Controlled   Updated Problems: No problems updated.  Plan of Care  Problem-specific:  Assessment and Plan            [name] has a current medication list which includes the following long-term medication(s): budesonide-formoterol, bupropion, escitalopram, gabapentin, hydrochlorothiazide , levothyroxine , montelukast , and rosuvastatin.  Pharmacotherapy (Medications Ordered): No orders of the defined types were placed in this encounter.  Orders:  No orders of the defined types were placed in this encounter.    Interventional Therapies  Risk Factors  Considerations  Medical Comorbidities:  ALLERGY: PCN  (04/14/2024) UDS (+) THC (Marijuana use)  BA  Bradycardia  HTN  PTSD  Depression     Planned   Pending:   Diagnostic/therapeutic right cervical ESI #2    Under consideration:   Diagnostic/therapeutic right interlaminar cervical ESI #2  Diagnostic bilateral cervical facet MBB #1 w/ possible RFA follow-up Diagnostic bilateral IA shoulder joint inj. #1  Diagnostic/therapeutic right T12-L1 LESI #  1  Diagnostic/therapeutic left L2-3 LESI #1  Diagnostic/therapeutic midline L2-3 LESI #1  Diagnostic/therapeutic midline L3-4 LESI #1  Diagnostic/therapeutic bilateral L2 TFESI #1  Diagnostic/therapeutic bilateral L3 TFESI #1  Diagnostic bilateral lumbar facet MBB #1 w/ possible RFA follow-up   Completed: (Analgesic benefit)1  Diagnostic/therapeutic right IL cervical ESI x1 (05/19/2024) (100/100/80/40)    Therapeutic  Palliative (PRN) options:   None established   Completed by other providers:   Diagnostic/therapeutic right C5-6 TFESI x1 (10/11/2023) by Morene Falcon, DO [facility])  Diagnostic/therapeutic bilateral L3-4 TFESI x1 (07/12/2023) by Morene Falcon, DO ([facility])   1(Analgesic benefit): Expressed in percentage (%). (Local anesthetic[LA] +/- sedation  L.A.Local Anesthetic  Steroid benefit  Ongoing benefit)     No follow-ups on file.    Recent Visits Date Type Provider Dept  06/18/24 Procedure visit Tanya Glisson, MD [facility]  06/08/24 Office Visit Tanya Glisson, MD [facility]  05/19/24 Procedure visit Tanya Glisson, MD [facility]  05/11/24 Office Visit Tanya Glisson, MD [facility]  04/14/24 Office Visit Tanya Glisson, MD [facility]  Showing recent visits within past 90 days and meeting all other requirements Future Appointments Date Type Provider Dept  07/06/24 Appointment Tanya Glisson, MD [facility]  Showing future appointments within next 90 days and meeting all other requirements  I discussed the assessment and treatment plan with the patient. The patient was  provided an opportunity to ask questions and all were answered. The patient agreed with the plan and demonstrated an understanding of the instructions.  Patient advised to call back or seek an in-person evaluation if the symptoms or condition worsens.  Duration of encounter: *** minutes.  Total time on encounter, as per AMA guidelines included both the face-to-face and non-face-to-face time personally spent by the physician and/or other qualified health care professional(s) on the day of the encounter (includes time in activities that require the physician or other qualified health care professional and does not include time in activities normally performed by clinical staff). Physician's time may include the following activities when performed: Preparing to see the patient (e.g., pre-charting review of records, searching for previously ordered imaging, lab work, and nerve conduction tests) Review of prior analgesic pharmacotherapies. Reviewing PMP Interpreting ordered tests (e.g., lab work, imaging, nerve conduction tests) Performing post-procedure evaluations, including interpretation of diagnostic procedures Obtaining and/or reviewing separately obtained history Performing a medically appropriate examination and/or evaluation Counseling and educating the patient/family/caregiver Ordering medications, tests, or procedures Referring and communicating with other health care professionals (when not separately reported) Documenting clinical information in the electronic or other health record Independently interpreting results (not separately reported) and communicating results to the patient/ family/caregiver Care coordination (not separately reported)  Note by: Glisson DELENA Tanya, MD (TTS and AI technology used. I apologize for any typographical errors that were not detected and corrected.) Date: 07/06/2024; Time: 10:50 AM

## 2024-07-06 ENCOUNTER — Encounter: Payer: Self-pay | Admitting: Pain Medicine

## 2024-07-06 ENCOUNTER — Ambulatory Visit: Attending: Pain Medicine | Admitting: Pain Medicine

## 2024-07-06 VITALS — BP 126/85 | HR 64 | Temp 97.4°F | Ht 64.0 in | Wt 178.0 lb

## 2024-07-06 DIAGNOSIS — M5030 Other cervical disc degeneration, unspecified cervical region: Secondary | ICD-10-CM | POA: Diagnosis present

## 2024-07-06 DIAGNOSIS — M4802 Spinal stenosis, cervical region: Secondary | ICD-10-CM | POA: Diagnosis present

## 2024-07-06 DIAGNOSIS — M5412 Radiculopathy, cervical region: Secondary | ICD-10-CM | POA: Diagnosis present

## 2024-07-06 DIAGNOSIS — M542 Cervicalgia: Secondary | ICD-10-CM | POA: Diagnosis present

## 2024-07-06 DIAGNOSIS — R937 Abnormal findings on diagnostic imaging of other parts of musculoskeletal system: Secondary | ICD-10-CM | POA: Insufficient documentation

## 2024-07-06 DIAGNOSIS — Z09 Encounter for follow-up examination after completed treatment for conditions other than malignant neoplasm: Secondary | ICD-10-CM | POA: Insufficient documentation

## 2024-07-06 NOTE — Progress Notes (Signed)
 Safety precautions to be maintained throughout the outpatient stay will include: orient to surroundings, keep bed in low position, maintain call bell within reach at all times, provide assistance with transfer out of bed and ambulation.

## 2024-07-06 NOTE — Patient Instructions (Signed)

## 2024-07-08 ENCOUNTER — Ambulatory Visit: Admitting: Family Medicine

## 2024-07-08 VITALS — BP 136/87 | HR 65 | Ht 67.0 in | Wt 177.7 lb

## 2024-07-08 DIAGNOSIS — R300 Dysuria: Secondary | ICD-10-CM

## 2024-07-08 DIAGNOSIS — E039 Hypothyroidism, unspecified: Secondary | ICD-10-CM

## 2024-07-08 DIAGNOSIS — Z0001 Encounter for general adult medical examination with abnormal findings: Secondary | ICD-10-CM | POA: Diagnosis not present

## 2024-07-08 DIAGNOSIS — Z0000 Encounter for general adult medical examination without abnormal findings: Secondary | ICD-10-CM

## 2024-07-08 DIAGNOSIS — E559 Vitamin D deficiency, unspecified: Secondary | ICD-10-CM

## 2024-07-08 DIAGNOSIS — I10 Essential (primary) hypertension: Secondary | ICD-10-CM

## 2024-07-08 DIAGNOSIS — R350 Frequency of micturition: Secondary | ICD-10-CM

## 2024-07-08 DIAGNOSIS — Z23 Encounter for immunization: Secondary | ICD-10-CM | POA: Diagnosis not present

## 2024-07-08 DIAGNOSIS — F339 Major depressive disorder, recurrent, unspecified: Secondary | ICD-10-CM

## 2024-07-08 DIAGNOSIS — G894 Chronic pain syndrome: Secondary | ICD-10-CM

## 2024-07-08 MED ORDER — NITROFURANTOIN MONOHYD MACRO 100 MG PO CAPS: 100.0000 mg | ORAL_CAPSULE | Freq: Two times a day (BID) | ORAL | 0 refills | Status: DC

## 2024-07-08 NOTE — Progress Notes (Signed)
 Complete physical exam  Patient: Heather Curry   DOB: Jun 25, 1966   58 y.o. Female  MRN: [id number]  Subjective:    Chief Complaint  Patient presents with   Annual Exam    Last completed 07/01/23 Diet - healthy Exercise - 3 times a week for 20 minutes along with daily stretch and physical therapy for her spine Feeling - fine Sleeping - poorly due to back pain Concerns - none    Heather Curry is a 58 y.o. female who presents today for a complete physical exam. She reports consuming a general diet. Does 20 minutes walks with daily back stretches She generally feels well. She reports sleeping poorly. She does have additional problems to discuss today.   Discussed the use of AI scribe software for clinical note transcription with the patient, who gave verbal consent to proceed.  History of Present Illness Heather Curry is a 58 year old female who presents for an annual physical exam.  Does have some concern for frequent urination with little output and a burning sensation for the past week. No odor or discharge is present. Feels it is an UTI  Medicallly hx - She has a history of chronic back pain due to a previous military injury, for which she has received several injections at the [facility] targeting cervical spine. She is preparing for a neurology consult for nerve ablations through the [facility]. Her pain management includes meloxicam  as needed, gabapentin 1200 mg daily (300 mg in the morning, 300 mg at lunch, and 600 mg at night), and tizanidine  as a muscle relaxer. She has tried various non-opioid treatments, including herbal supplements and CBD, to manage her pain.  Her current medications include hydrochlorothiazide  for blood pressure and a daily vitamin D  supplement of 1000 IU. She is also on Synthroid  for thyroid management - all managed through the [facility].  She is engaged in mental health care through the [facility], including weekly counseling for military sexual  trauma and cognitive pain management classes. She takes Lexapro 5 mg for mood management and has a designated mental health team due to her history of MST, PTSD, and MSD.  Her last mammogram was in June or July 2025, and she is up to date on her colonoscopy, with the next one due in 2028. She has not had a Pap smear in several years, with the last one possibly in 2020 per patient.   Most recent fall risk assessment:    07/08/2024    3:32 PM  Fall Risk   Falls in the past year? 1  Number falls in past yr: 1  Injury with Fall? 0  Risk for fall due to : History of fall(s)  Follow up Falls evaluation completed     Most recent depression screenings:    07/08/2024    3:32 PM 06/18/2024    1:25 PM  PHQ 2/9 Scores  PHQ - 2 Score 6 0  PHQ- 9 Score 25       07/08/2024    3:32 PM  GAD 7 : Generalized Anxiety Score  Nervous, Anxious, on Edge 3  Control/stop worrying 3  Worry too much - different things 3  Trouble relaxing 3  Restless 0  Easily annoyed or irritable 0  Afraid - awful might happen 3  Total GAD 7 Score 15  Anxiety Difficulty Extremely difficult      Vision:Within last year and Dental: No current dental problems and Receives regular dental care  Patient  Active Problem List   Diagnosis Date Noted   Cervical radiculopathy at C6 (Right) 07/06/2024   Neck pain of over 3 months duration 05/19/2024   Neck pain on right side 05/19/2024   Osteoarthritis of hips (Bilateral) 05/11/2024   Osteoarthritis of acromioclavicular joints (Bilateral) 05/11/2024   Osteoarthritis of glenohumeral joint (Right) 05/11/2024   Osteoarthritis of shoulders (Bilateral) 05/11/2024   DDD (degenerative disc disease), thoracic 05/11/2024   DDD (degenerative disc disease), lumbar 05/11/2024   Cervical facet hypertrophy (Multilevel) (Bilateral) 05/11/2024   Lumbar facet hypertrophy (Multilevel) (Bilateral) 05/11/2024   Abnormal drug screen (04/14/24) 04/19/2024   Marijuana use 04/19/2024    Asthma 04/14/2024   Asymptomatic bradycardia 04/14/2024   Chest pain, unspecified 04/14/2024   Encounter for other administrative examinations 04/14/2024   Chronic pain syndrome 04/14/2024   Pharmacologic therapy 04/14/2024   Disorder of skeletal system 04/14/2024   Problems influencing health status 04/14/2024   Abnormal MRI, lumbar spine (05/04/2023) 04/14/2024   Abnormal MRI, cervical spine (09/12/2023) 04/14/2024   Cervicalgia 04/14/2024   Chronic neck and back pain (1ry area of Pain) 04/14/2024   Chronic shoulder pain (2ry area of Pain) (Bilateral) 04/14/2024   Chronic upper extremity pain (3ry area of Pain) (Right) 04/14/2024   Cervical radiculopathy at C7 (Right) 04/14/2024   Cervical radiculopathy at C8 (Right) 04/14/2024   Cervical radiculopathy (Right) 04/14/2024   Cervical disc disorder with radiculopathy, cervicothoracic region 04/14/2024   Radicular pain of shoulder (Bilateral) 04/14/2024   Chronic low back pain (4th area of Pain) (Bilateral) w/ sciatica (Bilateral) 04/14/2024   Chronic low back pain (Bilateral) w/o sciatica 04/14/2024   Chronic hip pain (Bilateral) 04/14/2024   Chronic lower extremity pain (5th area of Pain) (Bilateral) 04/14/2024   Chronic lower extremity radicular pain (Bilateral) 04/14/2024   Lumbar facet joint pain 04/14/2024   Cervical facet joint pain 04/14/2024   Lumbar radiculitis (Bilateral) 04/14/2024   Lumbosacral radiculopathy at L5 (Bilateral) 04/14/2024   Lumbosacral radiculopathy at S1 (Bilateral) 04/14/2024   Chronic upper back pain 04/14/2024   Chronic thoracic back pain (Bilateral) 04/14/2024   Cervical facet arthropathy 04/14/2024   Cervical facet joint syndrome 04/14/2024   DDD (degenerative disc disease), cervical 04/14/2024   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, 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) 04/14/2024   Cervical spinal stenosis (C5-6) 04/14/2024   Chronic sacroiliac joint pain (Right) 04/14/2024   Thoracolumbar spine levoscoliosis  04/14/2024   Lumbar facet arthropathy (Multilevel) (Bilateral) 04/14/2024   DDD (degenerative disc disease), lumbar w/ LEP 04/14/2024   Lumbar intervertebral disc protrusion (Right: T12-L1) (Left: L2-3) 04/14/2024   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, L3-4) 04/14/2024   Lumbar spondylosis 04/14/2024   Low back pain of over 3 months duration 04/14/2024   Low back pain potentially associated with radiculopathy 04/14/2024   Low back pain radiating to legs (Bilateral) 04/14/2024   Multifactorial low back pain 04/14/2024   Avitaminosis D 07/01/2023   Elevated liver enzymes 07/01/2023   Mixed hyperlipidemia 07/01/2023   Chronic lumbar radiculopathy (Bilateral) 07/01/2023   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, L3-4) 07/01/2023   Impaired glucose metabolism 07/01/2023   Screening mammogram for breast cancer 07/01/2023   Hypothyroidism 02/26/2023   Posttraumatic stress disorder 02/26/2023   Chronic major depressive disorder, recurrent episode 02/26/2023   Encounter for screening mammogram for malignant neoplasm of breast 06/29/2022   Menopausal vasomotor syndrome 06/29/2022   Annual physical exam 06/27/2021   Essential hypertension 06/27/2021   Benign neoplasm of ascending colon  Benign neoplasm of cecum    Past Medical History:  Diagnosis Date   Colon polyps    Hypertension    Past Surgical History:  Procedure Laterality Date   APPENDECTOMY     BREAST BIOPSY Right 2007   neg   BREAST BIOPSY Right 09/27/2020   affirm breast bx, x marker, BENIGN MAMMARY PARENCHYMA WITH DENSE STROMAL FIBROSIS,   BREAST SURGERY Left    Lumpectomy   CERVIX REMOVAL     COLONOSCOPY WITH PROPOFOL  N/A 04/18/2018   Procedure: COLONOSCOPY WITH PROPOFOL ;  Surgeon: Janalyn Keene NOVAK, MD;  Location: [facility] ENDOSCOPY;  Service: Gastroenterology;  Laterality: N/A;   COLONOSCOPY WITH PROPOFOL  N/A 04/17/2019   Procedure: COLONOSCOPY WITH PROPOFOL ;  Surgeon: Janalyn Keene NOVAK, MD;  Location: [facility] ENDOSCOPY;  Service: Endoscopy;  Laterality: N/A;   COLONOSCOPY WITH PROPOFOL  N/A 06/01/2020   Procedure: COLONOSCOPY WITH PROPOFOL ;  Surgeon: Janalyn Keene NOVAK, MD;  Location: [facility] ENDOSCOPY;  Service: Endoscopy;  Laterality: N/A;   COLONOSCOPY WITH PROPOFOL  N/A 06/01/2022   Procedure: COLONOSCOPY WITH PROPOFOL ;  Surgeon: Unk Corinn Skiff, MD;  Location: [facility] ENDOSCOPY;  Service: Gastroenterology;  Laterality: N/A;   Allergies  Allergen Reactions   Bee Venom Anaphylaxis   Penicillins       Patient Care Team: Wellington Curtis LABOR, FNP as PCP - General (Family Medicine)   Outpatient Medications Prior to Visit  Medication Sig   aspirin EC 81 MG tablet Take 81 mg by mouth daily. Swallow whole.   budesonide-formoterol (SYMBICORT) 160-4.5 MCG/ACT inhaler Inhale into the lungs.   diclofenac Sodium (VOLTAREN) 1 % GEL Apply topically.   escitalopram (LEXAPRO) 5 MG tablet Take 5 mg by mouth daily.   gabapentin (NEURONTIN) 300 MG capsule Take 1 capsule by mouth 4 (four) times daily.   hydrochlorothiazide  (HYDRODIURIL ) 25 MG tablet Take 1 tablet (25 mg total) by mouth daily.   levothyroxine  (SYNTHROID ) 75 MCG tablet TAKE 1 TABLET BY MOUTH EVERY DAY   meloxicam  (MOBIC ) 15 MG tablet TAKE 1 TABLET (15 MG TOTAL) BY MOUTH DAILY.   montelukast  (SINGULAIR ) 10 MG tablet Take 1 tablet (10 mg total) by mouth at bedtime.   rosuvastatin (CRESTOR) 20 MG tablet Take 20 mg by mouth.   tiZANidine  (ZANAFLEX ) 4 MG tablet TAKE 1 TABLET BY MOUTH EVERY 6 HOURS AS NEEDED FOR MUSCLE SPASMS.   [DISCONTINUED] buPROPion (WELLBUTRIN XL) 150 MG 24 hr tablet Take 150 mg by mouth daily.   No facility-administered medications prior to visit.    ROS        Objective:     BP 136/87 (BP Location: Right Arm, Patient Position: Sitting, Cuff Size: Large)   Pulse 65   Ht 5' 7 (1.702 m)   Wt 177 lb 11.2 oz (80.6 kg)   SpO2 100%   BMI 27.83 kg/m  BP Readings from Last 3 Encounters:   07/08/24 136/87  07/06/24 126/85  06/18/24 108/81   Wt Readings from Last 3 Encounters:  07/08/24 177 lb 11.2 oz (80.6 kg)  07/06/24 178 lb (80.7 kg)  06/18/24 176 lb (79.8 kg)      Physical Exam Constitutional:      General: She is not in acute distress.    Appearance: Normal appearance. She is normal weight. She is not ill-appearing, toxic-appearing or diaphoretic.  HENT:     Head: Normocephalic.     Right Ear: Tympanic membrane, ear canal and external ear normal.     Left Ear: Tympanic membrane, ear canal and external ear normal.  Nose: Nose normal.     Mouth/Throat:     Mouth: Mucous membranes are moist.     Pharynx: Oropharynx is clear. No oropharyngeal exudate or posterior oropharyngeal erythema.  Eyes:     General: Lids are normal.     Extraocular Movements: Extraocular movements intact.     Right eye: Normal extraocular motion.     Left eye: Normal extraocular motion.     Conjunctiva/sclera: Conjunctivae normal.     Right eye: Right conjunctiva is not injected.     Left eye: Left conjunctiva is not injected.     Pupils: Pupils are equal, round, and reactive to light.  Neck:     Thyroid: No thyroid mass, thyromegaly or thyroid tenderness.     Vascular: No carotid bruit.  Cardiovascular:     Rate and Rhythm: Normal rate and regular rhythm.     Pulses: Normal pulses.          Radial pulses are 2+ on the right side and 2+ on the left side.       Posterior tibial pulses are 2+ on the right side and 2+ on the left side.     Heart sounds: Normal heart sounds, S1 normal and S2 normal. No murmur heard.    No friction rub. No gallop.  Pulmonary:     Effort: Pulmonary effort is normal. No respiratory distress.     Breath sounds: Normal breath sounds. No stridor. No wheezing, rhonchi or rales.  Chest:     Comments: Not assessed Abdominal:     General: Bowel sounds are normal. There is no distension.     Palpations: Abdomen is soft. There is no mass.     Tenderness:  There is no abdominal tenderness. There is no right CVA tenderness, left CVA tenderness, guarding or rebound.     Hernia: No hernia is present.  Genitourinary:    Comments: Not assessed  Musculoskeletal:        General: No swelling or tenderness. Normal range of motion.     Cervical back: Normal range of motion. No rigidity.     Right lower leg: No edema.     Left lower leg: No edema.  Lymphadenopathy:     Cervical: No cervical adenopathy.     Right cervical: No superficial, deep or posterior cervical adenopathy.    Left cervical: No superficial, deep or posterior cervical adenopathy.  Skin:    General: Skin is warm and dry.     Capillary Refill: Capillary refill takes less than 2 seconds.     Findings: No bruising or erythema.  Neurological:     General: No focal deficit present.     Mental Status: She is alert and oriented to person, place, and time. Mental status is at baseline.     GCS: GCS eye subscore is 4. GCS verbal subscore is 5. GCS motor subscore is 6.     Cranial Nerves: No cranial nerve deficit.     Sensory: No sensory deficit.     Motor: No weakness, tremor or pronator drift.     Coordination: Romberg sign negative. Coordination normal.     Gait: Gait is intact. Gait normal.  Psychiatric:        Attention and Perception: Attention and perception normal.        Mood and Affect: Mood and affect normal.        Speech: Speech normal.        Behavior: Behavior normal. Behavior is cooperative.  Thought Content: Thought content normal.        Cognition and Memory: Cognition and memory normal.        Judgment: Judgment normal.      Results for orders placed or performed in visit on 07/08/24  Urine Culture   Specimen: Urine   Urine  Result Value Ref Range   Urine Culture, Routine CANCELED   Microscopic Examination   Urine  Result Value Ref Range   WBC, UA 6-10 (A) 0 - 5 /hpf   RBC, Urine 0-2 0 - 2 /hpf   Epithelial Cells (non renal) 0-10 0 - 10 /hpf   Casts  None seen None seen /lpf   Bacteria, UA Few None seen/Few  Comprehensive metabolic panel with GFR  Result Value Ref Range   Glucose 94 70 - 99 mg/dL   BUN 16 6 - 24 mg/dL   Creatinine, Ser 9.05 0.57 - 1.00 mg/dL   eGFR 71 >40 fO/fpw/8.26   BUN/Creatinine Ratio 17 9 - 23   Sodium 140 134 - 144 mmol/L   Potassium 4.6 3.5 - 5.2 mmol/L   Chloride 104 96 - 106 mmol/L   CO2 24 20 - 29 mmol/L   Calcium 10.6 (H) 8.7 - 10.2 mg/dL   Total Protein 7.5 6.0 - 8.5 g/dL   Albumin 4.6 3.8 - 4.9 g/dL   Globulin, Total 2.9 1.5 - 4.5 g/dL   Bilirubin Total 0.3 0.0 - 1.2 mg/dL   Alkaline Phosphatase 162 (H) 49 - 135 IU/L   AST 19 0 - 40 IU/L   ALT 26 0 - 32 IU/L  CBC  Result Value Ref Range   WBC 7.4 3.4 - 10.8 x10E3/uL   RBC 4.84 3.77 - 5.28 x10E6/uL   Hemoglobin 13.6 11.1 - 15.9 g/dL   Hematocrit 57.8 65.9 - 46.6 %   MCV 87 79 - 97 fL   MCH 28.1 26.6 - 33.0 pg   MCHC 32.3 31.5 - 35.7 g/dL   RDW 87.2 88.2 - 84.5 %   Platelets 283 150 - 450 x10E3/uL  TSH  Result Value Ref Range   TSH 3.780 0.450 - 4.500 uIU/mL  Lipid panel  Result Value Ref Range   Cholesterol, Total 162 100 - 199 mg/dL   Triglycerides 62 0 - 149 mg/dL   HDL 39 (L) >60 mg/dL   VLDL Cholesterol Cal 12 5 - 40 mg/dL   LDL Chol Calc (NIH) 888 (H) 0 - 99 mg/dL   Chol/HDL Ratio 4.2 0.0 - 4.4 ratio  Urinalysis, Routine w reflex microscopic  Result Value Ref Range   Specific Gravity, UA 1.019 1.005 - 1.030   pH, UA 5.0 5.0 - 7.5   Color, UA Yellow Yellow   Appearance Ur Clear Clear   Leukocytes,UA 1+ (A) Negative   Protein,UA Negative Negative/Trace   Glucose, UA Negative Negative   Ketones, UA Negative Negative   RBC, UA Negative Negative   Bilirubin, UA Negative Negative   Urobilinogen, Ur 0.2 0.2 - 1.0 mg/dL   Nitrite, UA Positive (A) Negative   Microscopic Examination See below:   Specimen status report  Result Value Ref Range   specimen status report Comment   Specimen status report  Result Value Ref Range    specimen status report Comment        Assessment & Plan:    Routine Health Maintenance and Physical Exam  Assessment and Plan Assessment & Plan Annual Physical  Things to do to keep yourself healthy  -  Exercise at least 30-45 minutes a day, 3-4 days a week.  - Eat a low-fat diet with lots of fruits and vegetables, up to 7-9 servings per day.  - Seatbelts can save your life. Wear them always.  - Smoke detectors on every level of your home, check batteries every year.  - Eye Doctor - have an eye exam every 1-2 years  - Safe sex - if you may be exposed to STDs, use a condom.  - Alcohol -  If you drink, do it moderately, less than 2 drinks per day.  - Health Care Power of Attorney. Choose someone to speak for you if you are not able.  - Depression is common in our stressful world.If you're feeling down or losing interest in things you normally enjoy, please come in for a visit.  - Violence - If anyone is threatening or hurting you, please call immediately.  - UTD on mamm - UTD on colonoscopy - Administer Flu Vaccine today  Dysuria and increased frequency Symptoms suggestive of a urinary tract infection (UTI) including frequent urination, burning sensation, and low urine output. Symptoms started last week.  - Differential diagnosis includes UTI vs menopausal changes, denies recent sexual activity/new partners. - Recommend Azotabs for symptomatic relief - Advise increased water intake - Prescribe Macrobid  - check UA and urine culture. - Instruct to schedule an appointment if symptoms worsen  Chronic pain syndrome  -Mainly spine and back pain, associated with military injury - Currently managed with meloxicam , gabapentin, and tizanidine .  - Exploring non-opioid treatments and has tried various supplements. - medication and treatment management through pain mgmt who is referring pt to neurosurgery for potential nerve ablation.   Chronic Major Depression Disorder,  recurrent -associated with PTSD, MST, GAD - [facility] - [facility] and psych visits - on lexapro 5 mg  daily   Hypertension - Chronic, controlled - on hydrochlorothiazide  25mg  daily  - [facility] by [facility]  Hypothyroidism - On levothyroxine  75mcg daily - managed by [facility] - check TSH  Adult Wellness Visit Annual physical examination conducted. No significant changes in past medical, surgical, or family history. No recent labs since February, but liver enzymes were slightly elevated historically. No alcohol consumption reported. No changes in medications except for the addition of a daily Vitamin D  supplement (1000 IU). - Order general labs including thyroid function, electrolytes, blood count, and cholesterol level      Health Maintenance  Topic Date Due   Hepatitis B Vaccine (1 of 3 - 19+ 3-dose series) Never done   COVID-19 Vaccine (4 - 2025-26 season) 05/18/2024   Breast Cancer Screening  02/16/2026   Colon Cancer Screening  06/02/2027   DTaP/Tdap/Td vaccine (2 - Td or Tdap) 05/04/2029   Pneumococcal Vaccine for age over 67  Completed   Flu Shot  Completed   Hepatitis C Screening  Completed   HIV Screening  Completed   Zoster (Shingles) Vaccine  Completed   HPV Vaccine  Aged Out   Meningitis B Vaccine  Aged Out    Discussed health benefits of physical activity, and encouraged her to engage in regular exercise appropriate for her age and condition.  Annual physical exam -     Comprehensive metabolic panel with GFR -     CBC -     TSH -     Lipid panel  Dysuria -     Nitrofurantoin Monohyd Macro; Take 1 capsule (100 mg total) by mouth 2 (two) times daily.  Dispense: 10 capsule; Refill: 0 -     Urinalysis, Routine w reflex microscopic -     Urine Culture  Increased urinary frequency  Hypothyroidism, unspecified type  Primary hypertension  Chronic major depressive disorder, recurrent episode  Avitaminosis D  Chronic pain syndrome  Immunization due -      Flu vaccine trivalent PF, 6mos and older(Flulaval,Afluria,Fluarix,Fluzone) -     Pneumococcal conjugate vaccine 20-valent  Other orders -     Microscopic Examination -     Specimen status report -     Specimen status report    Return for annual physical.   I, Curtis DELENA Boom, FNP, have reviewed all documentation for this visit. The documentation on 07/11/24 for the exam, diagnosis, procedures, and orders are all accurate and complete.   Curtis DELENA Boom, FNP

## 2024-07-09 ENCOUNTER — Ambulatory Visit: Payer: Self-pay | Admitting: Family Medicine

## 2024-07-09 LAB — CBC
Hematocrit: 42.1 % (ref 34.0–46.6)
Hemoglobin: 13.6 g/dL (ref 11.1–15.9)
MCH: 28.1 pg (ref 26.6–33.0)
MCHC: 32.3 g/dL (ref 31.5–35.7)
MCV: 87 fL (ref 79–97)
Platelets: 283 x10E3/uL (ref 150–450)
RBC: 4.84 x10E6/uL (ref 3.77–5.28)
RDW: 12.7 % (ref 11.7–15.4)
WBC: 7.4 x10E3/uL (ref 3.4–10.8)

## 2024-07-09 LAB — COMPREHENSIVE METABOLIC PANEL WITH GFR
ALT: 26 IU/L (ref 0–32)
AST: 19 IU/L (ref 0–40)
Albumin: 4.6 g/dL (ref 3.8–4.9)
Alkaline Phosphatase: 162 IU/L — ABNORMAL HIGH (ref 49–135)
BUN/Creatinine Ratio: 17 (ref 9–23)
BUN: 16 mg/dL (ref 6–24)
Bilirubin Total: 0.3 mg/dL (ref 0.0–1.2)
CO2: 24 mmol/L (ref 20–29)
Calcium: 10.6 mg/dL — ABNORMAL HIGH (ref 8.7–10.2)
Chloride: 104 mmol/L (ref 96–106)
Creatinine, Ser: 0.94 mg/dL (ref 0.57–1.00)
Globulin, Total: 2.9 g/dL (ref 1.5–4.5)
Glucose: 94 mg/dL (ref 70–99)
Potassium: 4.6 mmol/L (ref 3.5–5.2)
Sodium: 140 mmol/L (ref 134–144)
Total Protein: 7.5 g/dL (ref 6.0–8.5)
eGFR: 71 mL/min/1.73 (ref >59)

## 2024-07-09 LAB — LIPID PANEL
Chol/HDL Ratio: 4.2 ratio (ref 0.0–4.4)
Cholesterol, Total: 162 mg/dL (ref 100–199)
HDL: 39 mg/dL — ABNORMAL LOW (ref >39)
LDL Chol Calc (NIH): 111 mg/dL — ABNORMAL HIGH (ref 0–99)
Triglycerides: 62 mg/dL (ref 0–149)
VLDL Cholesterol Cal: 12 mg/dL (ref 5–40)

## 2024-07-09 LAB — TSH: TSH: 3.78 u[IU]/mL (ref 0.450–4.500)

## 2024-07-10 ENCOUNTER — Ambulatory Visit: Payer: Self-pay | Admitting: Family Medicine

## 2024-07-10 LAB — URINALYSIS, ROUTINE W REFLEX MICROSCOPIC
Bilirubin, UA: NEGATIVE
Glucose, UA: NEGATIVE
Ketones, UA: NEGATIVE
Nitrite, UA: POSITIVE — AB
Protein,UA: NEGATIVE
RBC, UA: NEGATIVE
Specific Gravity, UA: 1.019 (ref 1.005–1.030)
Urobilinogen, Ur: 0.2 mg/dL (ref 0.2–1.0)
pH, UA: 5 (ref 5.0–7.5)

## 2024-07-10 LAB — MICROSCOPIC EXAMINATION: Casts: NONE SEEN /LPF

## 2024-07-10 LAB — SPECIMEN STATUS REPORT

## 2024-07-11 ENCOUNTER — Encounter: Payer: Self-pay | Admitting: Family Medicine

## 2024-07-11 LAB — SPECIMEN STATUS REPORT

## 2024-07-11 LAB — URINE CULTURE

## 2024-07-22 ENCOUNTER — Ambulatory Visit (INDEPENDENT_AMBULATORY_CARE_PROVIDER_SITE_OTHER): Admitting: Neurosurgery

## 2024-07-22 ENCOUNTER — Encounter: Payer: Self-pay | Admitting: Neurosurgery

## 2024-07-22 VITALS — BP 104/70 | Ht 64.0 in | Wt 181.1 lb

## 2024-07-22 DIAGNOSIS — M546 Pain in thoracic spine: Secondary | ICD-10-CM | POA: Diagnosis not present

## 2024-07-22 DIAGNOSIS — M5416 Radiculopathy, lumbar region: Secondary | ICD-10-CM | POA: Diagnosis not present

## 2024-07-22 DIAGNOSIS — M4124 Other idiopathic scoliosis, thoracic region: Secondary | ICD-10-CM | POA: Diagnosis not present

## 2024-07-22 DIAGNOSIS — G8929 Other chronic pain: Secondary | ICD-10-CM

## 2024-07-22 DIAGNOSIS — M4714 Other spondylosis with myelopathy, thoracic region: Secondary | ICD-10-CM

## 2024-07-22 DIAGNOSIS — M5412 Radiculopathy, cervical region: Secondary | ICD-10-CM | POA: Diagnosis not present

## 2024-07-22 DIAGNOSIS — M25511 Pain in right shoulder: Secondary | ICD-10-CM

## 2024-07-22 NOTE — Progress Notes (Signed)
 Referring Physician:  Wellington Curtis LABOR, FNP [address] [place],  [place] [address]  Primary Physician:  Wellington Curtis LABOR, FNP  History of Present Illness: 07/22/2024 [name] is a 58 y.o with a history of chronic major depressive disorder, HTN, hypothyroidism, and chronic pain syndrome from a military injury here today with a chief complaint of neck pain that radiates into her right arm and fingers. She has seen PM&R and undergone injections.  Today despite being referred for cervical complaints she describes both neck and lumbar symptoms She states she has constant pain and numbness in her neck and down her right arm with tightness between her shoulder pains. This is made worse by sitting at her desk and movement of her arm She also describes tingling in both legs that feels like they are asleep that radiates into her feet R>L. This is made worse with sitting or standing for prolonged periods of time and improves with laying down.   Conservative measures:  Physical therapy: 04/29/23- 05/30/23 at [facility] without relief.  Multimodal medical therapy including regular antiinflammatories:  gabapentin, meloxicam , tizanidine  Injections:   07/11/2024 C7-T1 06/18/2024 C7-T1 05/19/2024 right-sided C5-6    Past Surgery: no prior spine surgeries  Heather Curry has no symptoms of cervical myelopathy.  The symptoms are causing a significant impact on the patient's life.   Review of Systems:  A 10 point review of systems is negative, except for the pertinent positives and negatives detailed in the HPI.  Past Medical History: Past Medical History:  Diagnosis Date   Colon polyps    Hypertension     Past Surgical History: Past Surgical History:  Procedure Laterality Date   APPENDECTOMY     BREAST BIOPSY Right 2007   neg   BREAST BIOPSY Right 09/27/2020   affirm breast bx, x marker, BENIGN MAMMARY PARENCHYMA WITH DENSE STROMAL FIBROSIS,   BREAST SURGERY Left     Lumpectomy   CERVIX REMOVAL     COLONOSCOPY WITH PROPOFOL  N/A 04/18/2018   Procedure: COLONOSCOPY WITH PROPOFOL ;  Surgeon: Janalyn Keene NOVAK, MD;  Location: [facility] ENDOSCOPY;  Service: Gastroenterology;  Laterality: N/A;   COLONOSCOPY WITH PROPOFOL  N/A 04/17/2019   Procedure: COLONOSCOPY WITH PROPOFOL ;  Surgeon: Janalyn Keene NOVAK, MD;  Location: [facility] ENDOSCOPY;  Service: Endoscopy;  Laterality: N/A;   COLONOSCOPY WITH PROPOFOL  N/A 06/01/2020   Procedure: COLONOSCOPY WITH PROPOFOL ;  Surgeon: Janalyn Keene NOVAK, MD;  Location: [facility] ENDOSCOPY;  Service: Endoscopy;  Laterality: N/A;   COLONOSCOPY WITH PROPOFOL  N/A 06/01/2022   Procedure: COLONOSCOPY WITH PROPOFOL ;  Surgeon: Unk Corinn Skiff, MD;  Location: [facility] ENDOSCOPY;  Service: Gastroenterology;  Laterality: N/A;    Allergies: Allergies as of 07/22/2024 - Review Complete 07/11/2024  Allergen Reaction Noted   Bee venom Anaphylaxis 02/14/2018   Penicillins  04/17/2019    Medications: Outpatient Encounter Medications as of 07/22/2024  Medication Sig   aspirin EC 81 MG tablet Take 81 mg by mouth daily. Swallow whole.   budesonide-formoterol (SYMBICORT) 160-4.5 MCG/ACT inhaler Inhale into the lungs.   diclofenac Sodium (VOLTAREN) 1 % GEL Apply topically.   escitalopram (LEXAPRO) 10 MG tablet Take 10 mg by mouth daily.   gabapentin (NEURONTIN) 300 MG capsule Take 1 capsule by mouth 4 (four) times daily.   hydrochlorothiazide  (HYDRODIURIL ) 25 MG tablet Take 1 tablet (25 mg total) by mouth daily.   levothyroxine  (SYNTHROID ) 75 MCG tablet TAKE 1 TABLET BY MOUTH EVERY DAY   meloxicam  (MOBIC ) 15 MG tablet TAKE 1  TABLET (15 MG TOTAL) BY MOUTH DAILY.   montelukast  (SINGULAIR ) 10 MG tablet Take 1 tablet (10 mg total) by mouth at bedtime.   rosuvastatin (CRESTOR) 20 MG tablet Take 20 mg by mouth.   tiZANidine  (ZANAFLEX ) 4 MG tablet TAKE 1 TABLET BY MOUTH EVERY 6 HOURS AS NEEDED FOR MUSCLE SPASMS.   [DISCONTINUED] escitalopram (LEXAPRO) 5 MG  tablet Take 5 mg by mouth daily.   [DISCONTINUED] nitrofurantoin, macrocrystal-monohydrate, (MACROBID) 100 MG capsule Take 1 capsule (100 mg total) by mouth 2 (two) times daily.   No facility-administered encounter medications on file as of 07/22/2024.    Social History: Social History   Tobacco Use   Smoking status: Former    Current packs/day: 0.50    Average packs/day: 0.5 packs/day for 17.8 years (8.9 ttl pk-yrs)    Types: Cigarettes    Start date: 2008   Smokeless tobacco: Never  Vaping Use   Vaping status: Never Used  Substance Use Topics   Alcohol use: Yes    Comment: rarely   Drug use: Never    Family Medical History: Family History  Problem Relation Age of Onset   Congestive Heart Failure Mother    Hypertension Mother    Arthritis Mother    Alcohol abuse Father    Hypertension Father    COPD Father    Arthritis Maternal Grandmother    Cancer Maternal Grandmother    Hypertension Sister    Breast cancer Maternal Aunt 80    Physical Examination: Today's Vitals   07/22/24 1340  BP: 104/70  Weight: 181 lb 2 oz (82.2 kg)  Height: 5' 4 (1.626 m)  PainSc: 7   PainLoc: Back   Body mass index is 31.09 kg/m.   General: Patient is well developed, well nourished, calm, collected, and in no apparent distress. Attention to examination is appropriate.  Psychiatric: Patient is non-anxious.  Head:  Pupils equal, round, and reactive to light.  ENT:  Oral mucosa appears well hydrated.  Neck:   Supple.    Respiratory: Patient is breathing without any difficulty.  Extremities: No edema.  Vascular: Palpable dorsal pedal pulses.  Skin:   On exposed skin, there are no abnormal skin lesions.  NEUROLOGICAL:     Awake, alert, oriented to person, place, and time.  Speech is clear and fluent. Fund of knowledge is appropriate.   Cranial Nerves: Pupils equal round and reactive to light.  Facial tone is symmetric.  Facial sensation is symmetric.  ROM of spine:  limited  Palpation of spine: diffuse TTP  Strength: Side Biceps Triceps Deltoid Interossei Grip Wrist Ext. Wrist Flex.  R 5 5 5 5 5 5 5   L 5 5 5 5 5 5 5    Side Iliopsoas Quads Hamstring PF DF EHL  R 4 4 4 4 4 4   L 4 4 4 4 4 4    Reflexes are 2+ and symmetric at the biceps, triceps, brachioradialis, patella and achilles.   Hoffman's is absent.  Clonus is not present.  Toes are down-going.  Bilateral upper and lower extremity sensation is intact to light touch.    Gait is normal.    MSK: + empty can on the right   Medical Decision Making  Imaging: 04/18/24 thoracic xrays IMPRESSION: 1. Levoscoliotic curvature at the thoracolumbar junction. 2. Degenerative disc disease T11-T12.  Anterior spurring at T12-L1.     Electronically Signed   By: Andrea Gasman M.D.   On: 04/26/2024 12:43  MRI C spine 08/29/23 IMPRESSION:  1. C5-C6 mild spinal canal stenosis and mild right neural foraminal narrowing. 2. C4-C5 severe left neural foraminal narrowing. 3. C3-C4 mild left neural foraminal narrowing.     Electronically Signed   By: Donald Campion M.D.   On: 09/12/2023 01:19  I have personally reviewed the images and agree with the above interpretation.  Assessment and Plan: Heather Curry is a pleasant 58 y.o. female with neck and lumbar complaints. She does have a degree of shoulder pain that is reproducable. I recommended further evaluation by ortho for this. I also recommended MRI cervical spine given that her symptoms have significantly worsened since her MRI in 2024. I also recommended thoracic MRI as she is having thoracic pain and xrays show scoliosis. We will also obtain an updated lumbar MRI given her radicular symptoms have worsened since this study and she has responded well to injections in the past. She is not interested in any additional cervical injections. [facility] I will message her via mychart with the results of her imaging and  recommendations for further plan of care. She was encouraged to contact us  in the meantime with any questions or concerns.     Thank you for involving me in the care of this patient.   I spent a total of 63 minutes in both face-to-face and non-face-to-face activities for this visit on the date of this encounter including review of outside imaging, review of symptoms regarding neck, thoracic spine and lumbar spine, discussion of differential diagnosis, order placement, and documentation.   [facility]
